# Patient Record
Sex: Female | Born: 1950 | Race: Black or African American | Hispanic: No | Marital: Married | Smoking: Never smoker
Health system: Southern US, Community
[De-identification: ages and names within clinical notes are randomized; demographics above are authoritative.]

## PROBLEM LIST (undated history)

## (undated) DIAGNOSIS — M5430 Sciatica, unspecified side: Secondary | ICD-10-CM

## (undated) DIAGNOSIS — I10 Essential (primary) hypertension: Secondary | ICD-10-CM

## (undated) DIAGNOSIS — J349 Unspecified disorder of nose and nasal sinuses: Secondary | ICD-10-CM

## (undated) DIAGNOSIS — M1710 Unilateral primary osteoarthritis, unspecified knee: Secondary | ICD-10-CM

## (undated) DIAGNOSIS — H269 Unspecified cataract: Secondary | ICD-10-CM

## (undated) HISTORY — DX: Unspecified cataract: H26.9

## (undated) HISTORY — DX: Sciatica, unspecified side: M54.30

## (undated) HISTORY — DX: Unspecified disorder of nose and nasal sinuses: J34.9

## (undated) HISTORY — DX: Unilateral primary osteoarthritis, unspecified knee: M17.10

## (undated) HISTORY — PX: OTHER SURGICAL HISTORY: SHX169

## (undated) HISTORY — DX: Essential (primary) hypertension: I10

---

## 1997-07-10 ENCOUNTER — Ambulatory Visit (HOSPITAL_COMMUNITY): Admission: RE | Admit: 1997-07-10 | Discharge: 1997-07-10 | Payer: Self-pay

## 1998-04-03 ENCOUNTER — Emergency Department (HOSPITAL_COMMUNITY): Admission: EM | Admit: 1998-04-03 | Discharge: 1998-04-03 | Payer: Self-pay | Admitting: Emergency Medicine

## 1998-04-04 ENCOUNTER — Encounter: Payer: Self-pay | Admitting: Emergency Medicine

## 1998-06-07 ENCOUNTER — Encounter: Payer: Self-pay | Admitting: Emergency Medicine

## 1998-06-08 ENCOUNTER — Inpatient Hospital Stay (HOSPITAL_COMMUNITY): Admission: EM | Admit: 1998-06-08 | Discharge: 1998-06-10 | Payer: Self-pay | Admitting: *Deleted

## 1998-06-10 ENCOUNTER — Encounter: Payer: Self-pay | Admitting: Cardiology

## 1999-09-30 ENCOUNTER — Ambulatory Visit (HOSPITAL_COMMUNITY): Admission: RE | Admit: 1999-09-30 | Discharge: 1999-09-30 | Payer: Self-pay | Admitting: Cardiology

## 1999-09-30 ENCOUNTER — Encounter: Payer: Self-pay | Admitting: Cardiology

## 1999-12-16 ENCOUNTER — Ambulatory Visit (HOSPITAL_COMMUNITY): Admission: RE | Admit: 1999-12-16 | Discharge: 1999-12-16 | Payer: Self-pay | Admitting: Cardiology

## 1999-12-16 ENCOUNTER — Encounter: Payer: Self-pay | Admitting: Cardiology

## 2000-03-05 ENCOUNTER — Ambulatory Visit (HOSPITAL_COMMUNITY): Admission: RE | Admit: 2000-03-05 | Discharge: 2000-03-05 | Payer: Self-pay | Admitting: Cardiology

## 2000-07-05 ENCOUNTER — Encounter: Admission: RE | Admit: 2000-07-05 | Discharge: 2000-07-05 | Payer: Self-pay | Admitting: Cardiology

## 2000-07-05 ENCOUNTER — Encounter: Payer: Self-pay | Admitting: Cardiology

## 2000-08-10 ENCOUNTER — Emergency Department (HOSPITAL_COMMUNITY): Admission: EM | Admit: 2000-08-10 | Discharge: 2000-08-11 | Payer: Self-pay | Admitting: *Deleted

## 2000-08-16 ENCOUNTER — Emergency Department (HOSPITAL_COMMUNITY): Admission: EM | Admit: 2000-08-16 | Discharge: 2000-08-16 | Payer: Self-pay | Admitting: Emergency Medicine

## 2000-08-16 ENCOUNTER — Encounter: Payer: Self-pay | Admitting: Emergency Medicine

## 2000-08-24 ENCOUNTER — Encounter: Admission: RE | Admit: 2000-08-24 | Discharge: 2000-09-10 | Payer: Self-pay | Admitting: Cardiology

## 2000-10-05 ENCOUNTER — Encounter: Admission: RE | Admit: 2000-10-05 | Discharge: 2000-10-27 | Payer: Self-pay | Admitting: Cardiology

## 2001-02-07 ENCOUNTER — Encounter: Payer: Self-pay | Admitting: Gastroenterology

## 2001-02-07 DIAGNOSIS — K648 Other hemorrhoids: Secondary | ICD-10-CM | POA: Insufficient documentation

## 2001-02-22 ENCOUNTER — Encounter: Payer: Self-pay | Admitting: Cardiology

## 2001-02-22 ENCOUNTER — Encounter: Admission: RE | Admit: 2001-02-22 | Discharge: 2001-02-22 | Payer: Self-pay | Admitting: Cardiology

## 2002-05-18 ENCOUNTER — Other Ambulatory Visit: Admission: RE | Admit: 2002-05-18 | Discharge: 2002-05-18 | Payer: Self-pay | Admitting: Obstetrics and Gynecology

## 2003-02-03 ENCOUNTER — Emergency Department (HOSPITAL_COMMUNITY): Admission: EM | Admit: 2003-02-03 | Discharge: 2003-02-04 | Payer: Self-pay | Admitting: Emergency Medicine

## 2003-05-31 ENCOUNTER — Other Ambulatory Visit: Admission: RE | Admit: 2003-05-31 | Discharge: 2003-05-31 | Payer: Self-pay | Admitting: Obstetrics and Gynecology

## 2003-06-20 ENCOUNTER — Encounter: Admission: RE | Admit: 2003-06-20 | Discharge: 2003-06-20 | Payer: Self-pay | Admitting: Cardiology

## 2003-07-11 ENCOUNTER — Encounter: Admission: RE | Admit: 2003-07-11 | Discharge: 2003-07-11 | Payer: Self-pay | Admitting: Cardiology

## 2004-10-21 ENCOUNTER — Other Ambulatory Visit: Admission: RE | Admit: 2004-10-21 | Discharge: 2004-10-21 | Payer: Self-pay | Admitting: Obstetrics and Gynecology

## 2006-05-21 ENCOUNTER — Encounter: Admission: RE | Admit: 2006-05-21 | Discharge: 2006-05-21 | Payer: Self-pay | Admitting: Orthopedic Surgery

## 2007-05-31 ENCOUNTER — Ambulatory Visit: Payer: Self-pay | Admitting: Gastroenterology

## 2007-05-31 LAB — CONVERTED CEMR LAB
ALT: 22 units/L (ref 0–35)
Basophils Absolute: 0.1 10*3/uL (ref 0.0–0.1)
Basophils Relative: 3.4 % — ABNORMAL HIGH (ref 0.0–1.0)
Calcium: 9.3 mg/dL (ref 8.4–10.5)
Creatinine, Ser: 1 mg/dL (ref 0.4–1.2)
Eosinophils Absolute: 0.2 10*3/uL (ref 0.0–0.7)
GFR calc Af Amer: 74 mL/min
GFR calc non Af Amer: 61 mL/min
Hemoglobin: 13.9 g/dL (ref 12.0–15.0)
MCHC: 32.9 g/dL (ref 30.0–36.0)
MCV: 89.1 fL (ref 78.0–100.0)
Monocytes Absolute: 0.3 10*3/uL (ref 0.1–1.0)
Neutro Abs: 0.8 10*3/uL — ABNORMAL LOW (ref 1.4–7.7)
RBC: 4.74 M/uL (ref 3.87–5.11)
RDW: 12.2 % (ref 11.5–14.6)
Sodium: 143 meq/L (ref 135–145)
TSH: 2.12 microintl units/mL (ref 0.35–5.50)
Total Bilirubin: 1.2 mg/dL (ref 0.3–1.2)

## 2007-06-21 DIAGNOSIS — K219 Gastro-esophageal reflux disease without esophagitis: Secondary | ICD-10-CM | POA: Insufficient documentation

## 2007-06-21 DIAGNOSIS — F411 Generalized anxiety disorder: Secondary | ICD-10-CM | POA: Insufficient documentation

## 2008-06-13 ENCOUNTER — Emergency Department (HOSPITAL_COMMUNITY): Admission: EM | Admit: 2008-06-13 | Discharge: 2008-06-13 | Payer: Self-pay | Admitting: Emergency Medicine

## 2008-10-15 ENCOUNTER — Encounter: Admission: RE | Admit: 2008-10-15 | Discharge: 2008-11-22 | Payer: Self-pay | Admitting: Cardiology

## 2010-04-25 ENCOUNTER — Emergency Department (HOSPITAL_COMMUNITY)
Admission: EM | Admit: 2010-04-25 | Discharge: 2010-04-26 | Disposition: A | Discharge status: Home / Self Care | Payer: BC Managed Care – PPO | Attending: Emergency Medicine | Admitting: Emergency Medicine

## 2010-04-25 DIAGNOSIS — J029 Acute pharyngitis, unspecified: Secondary | ICD-10-CM | POA: Insufficient documentation

## 2010-04-25 DIAGNOSIS — I10 Essential (primary) hypertension: Secondary | ICD-10-CM | POA: Insufficient documentation

## 2010-04-25 DIAGNOSIS — R079 Chest pain, unspecified: Secondary | ICD-10-CM | POA: Insufficient documentation

## 2010-04-25 DIAGNOSIS — T189XXA Foreign body of alimentary tract, part unspecified, initial encounter: Secondary | ICD-10-CM | POA: Insufficient documentation

## 2010-04-25 DIAGNOSIS — IMO0002 Reserved for concepts with insufficient information to code with codable children: Secondary | ICD-10-CM | POA: Insufficient documentation

## 2010-04-25 DIAGNOSIS — K219 Gastro-esophageal reflux disease without esophagitis: Secondary | ICD-10-CM | POA: Insufficient documentation

## 2010-04-26 ENCOUNTER — Emergency Department (HOSPITAL_COMMUNITY): Payer: BC Managed Care – PPO

## 2010-04-29 ENCOUNTER — Other Ambulatory Visit: Payer: Self-pay | Admitting: Cardiology

## 2010-04-29 DIAGNOSIS — R42 Dizziness and giddiness: Secondary | ICD-10-CM

## 2010-05-01 ENCOUNTER — Inpatient Hospital Stay: Admission: RE | Admit: 2010-05-01 | Payer: BC Managed Care – PPO | Source: Ambulatory Visit

## 2010-06-17 NOTE — Assessment & Plan Note (Signed)
[facility]                         GASTROENTEROLOGY OFFICE NOTE   CHERRE, KOTHARI                         MRN:          [id number]  DATE:05/31/2007                            DOB:          1951-01-06    SUBJECTIVE:  Ms. Eilert is a 60 year old African/American female with new  onset constipation over the last six months with hard stools, although  she is having daily bowel movements.  She occasionally has some  irritation around her rectum and rectal bleeding.  She denies any upper  GI or hepatobiliary complaints.  She is on a regular diet and has known  lactose intolerance.  She continues to use lactose.  She denies any  alarming complaints otherwise.  She has had no anorexia or weight loss,  upper GI or hepatobiliary problems.   I previously saw this patient because of acid reflux symptoms in 2003,  and she had a negative endoscopy.  Also at that time she was having  hematochezia and had internal hemorrhoids noted on a colonoscopy, but  otherwise a normal exam.   PAST MEDICAL HISTORY/MEDICATIONS:  Is otherwise entirely noncontributory  except for chronic anxiety syndrome.  She recently has been treated for  a fungal infection in her left foot and has been on Lamisil and  questionable Cipro.  In her own mind, this has precipitated her  constipation problems.   ALLERGIES:  NOVOCAIN.   FAMILY HISTORY:  Noncontributory except for diabetes in her mother.   SOCIAL HISTORY:  She is married and lives with her husband.  She works  as a Manufacturing engineer.  She has a Bachelor's degree. She does not smoke or  abuse ethanol.   REVIEW OF SYSTEMS:  Entirely noncontributory.  It is unclear if the  patient has had recent laboratory screening with Dr. Osvaldo Shipper. Spruill.   PHYSICAL EXAMINATION:  GENERAL:  She is a healthy-appearing black  female, appearing her stated age, in no acute distress.  VITAL SIGNS:  She is 5 feet 4 inches and weighs 222 pounds.   Blood  pressure 120/84, pulse 64 and regular.  HEENT/NECK:  I could not appreciate stigmata of chronic liver disease or  thyromegaly.  CHEST:  Clear.  HEART:  She was in a regular rhythm without murmurs, gallops or rubs.  ABDOMEN:  Showed no organomegaly, masses or tenderness.  Bowel sounds  were normal.  EXTREMITIES:  Peripheral extremities were unremarkable.  RECTAL:  Inspection of the rectum showed some external hemorrhoids  without any definite fissures or fistulae.  The rectal exam showed a  hard stool in the rectal vault that was guaiac-negative.  NEUROLOGIC:  Mental status was clear.   ASSESSMENT:  1. 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      fluids in her diet.  2. Rectal bleeding from external hemorrhoids.  3. Rule out underlying metabolic disorder.  4. History of lactose intolerance.   RECOMMENDATIONS:  1. A high fiber diet with daily Benefiber and liberal p.o. fluids.  2. Lactose-free diet with p.r.n. lactate tablets.  3. Sitz baths at bedtime with AnaMantle  cream.  4. GI followup in two to three weeks' time.  The patient needs a      follow-up colonoscopy.  She is currently refusing.  5. Will check screening laboratory parameters including thyroid      function tests.     Vania Rea. Jarold Motto, MD, Caleen Essex, FAGA  Electronically Signed    DRP/MedQ  DD: 05/31/2007  DT: 05/31/2007  Job #: [id number]   cc:   Osvaldo Shipper. Spruill, M.D.

## 2012-10-17 ENCOUNTER — Ambulatory Visit: Payer: BC Managed Care – PPO

## 2012-10-17 ENCOUNTER — Ambulatory Visit (INDEPENDENT_AMBULATORY_CARE_PROVIDER_SITE_OTHER): Payer: BC Managed Care – PPO | Admitting: Internal Medicine

## 2012-10-17 VITALS — BP 132/84 | HR 70 | Temp 98.3°F | Resp 17 | Ht 63.0 in | Wt 212.0 lb

## 2012-10-17 DIAGNOSIS — M25579 Pain in unspecified ankle and joints of unspecified foot: Secondary | ICD-10-CM

## 2012-10-17 DIAGNOSIS — M25572 Pain in left ankle and joints of left foot: Secondary | ICD-10-CM

## 2012-10-17 DIAGNOSIS — S93409A Sprain of unspecified ligament of unspecified ankle, initial encounter: Secondary | ICD-10-CM

## 2012-10-17 MED ORDER — TRAMADOL HCL 50 MG PO TABS: 50.0000 mg | ORAL_TABLET | Freq: Three times a day (TID) | ORAL | Status: DC | PRN

## 2012-10-17 NOTE — Progress Notes (Signed)
  Subjective:    Patient ID: Sheri Griffith, female    DOB: 12-Nov-1950, 62 y.o.   MRN: [id number]  HPI Pain and swelling left ankle. Yesterday twisted her ankle while stepping down a cement step. Heard a pop. Pain 6 out of 10. Able to bear weight today with pain on ambulation.  Review of Systems  Musculoskeletal: Positive for joint swelling.       Pain and swelling left ankle  All other systems reviewed and are negative.       Objective:   Physical Exam  Nursing note and vitals reviewed. Constitutional: She is oriented to person, place, and time. She appears well-developed and well-nourished.  HENT:  Head: Normocephalic and atraumatic.  Nose: Nose normal.  Mouth/Throat: Oropharynx is clear and moist.  Eyes: Conjunctivae are normal. Pupils are equal, round, and reactive to light.  Neck: Normal range of motion. Neck supple.  Cardiovascular: Normal rate, regular rhythm and normal heart sounds.   Pulmonary/Chest: Effort normal and breath sounds normal.  Abdominal: Soft.  Musculoskeletal: She exhibits tenderness.  Tenderness swelling l ankle  Neurological: She is alert and oriented to person, place, and time.  Skin: Skin is warm and dry.  Psychiatric: She has a normal mood and affect. Her behavior is normal. Judgment and thought content normal.    [facility] reading (PRIMARY) by  Dr. Mindi Junker negitive for fracture       Assessment & Plan:  Pain and swelling left ankle will eval xray and treat with immobilization as approriate and antiinflammatory pain meds.

## 2012-10-17 NOTE — Patient Instructions (Addendum)
Take meds as directed. Keep ankle elevated. Ice every 4 hours for 20 minutes. Immobilize as directed wear the stirrup splint in your shoe for 3 weeks and elevate as directed.. Return for followup eval in one week.

## 2013-03-21 ENCOUNTER — Ambulatory Visit: Payer: BC Managed Care – PPO | Admitting: Physical Therapy

## 2013-05-15 ENCOUNTER — Ambulatory Visit: Payer: BC Managed Care – PPO | Attending: Cardiology | Admitting: Physical Therapy

## 2013-05-15 DIAGNOSIS — M545 Low back pain, unspecified: Secondary | ICD-10-CM | POA: Insufficient documentation

## 2013-05-15 DIAGNOSIS — IMO0001 Reserved for inherently not codable concepts without codable children: Secondary | ICD-10-CM | POA: Insufficient documentation

## 2013-05-15 DIAGNOSIS — R293 Abnormal posture: Secondary | ICD-10-CM | POA: Insufficient documentation

## 2013-05-15 DIAGNOSIS — M542 Cervicalgia: Secondary | ICD-10-CM | POA: Insufficient documentation

## 2013-05-15 DIAGNOSIS — M546 Pain in thoracic spine: Secondary | ICD-10-CM | POA: Diagnosis not present

## 2013-05-16 ENCOUNTER — Ambulatory Visit: Payer: BC Managed Care – PPO | Admitting: Rehabilitation

## 2013-05-16 DIAGNOSIS — IMO0001 Reserved for inherently not codable concepts without codable children: Secondary | ICD-10-CM | POA: Diagnosis not present

## 2013-05-24 ENCOUNTER — Ambulatory Visit: Payer: BC Managed Care – PPO | Attending: Cardiology | Admitting: Physical Therapy

## 2013-05-24 DIAGNOSIS — R293 Abnormal posture: Secondary | ICD-10-CM | POA: Insufficient documentation

## 2013-05-24 DIAGNOSIS — M545 Low back pain, unspecified: Secondary | ICD-10-CM | POA: Insufficient documentation

## 2013-05-24 DIAGNOSIS — Z5189 Encounter for other specified aftercare: Secondary | ICD-10-CM | POA: Insufficient documentation

## 2013-05-24 DIAGNOSIS — M546 Pain in thoracic spine: Secondary | ICD-10-CM | POA: Insufficient documentation

## 2013-05-24 DIAGNOSIS — M542 Cervicalgia: Secondary | ICD-10-CM | POA: Insufficient documentation

## 2013-05-25 ENCOUNTER — Ambulatory Visit: Payer: BC Managed Care – PPO | Admitting: Rehabilitation

## 2013-05-25 DIAGNOSIS — IMO0001 Reserved for inherently not codable concepts without codable children: Secondary | ICD-10-CM | POA: Diagnosis not present

## 2013-05-31 ENCOUNTER — Ambulatory Visit: Payer: BC Managed Care – PPO | Admitting: Rehabilitation

## 2013-05-31 DIAGNOSIS — IMO0001 Reserved for inherently not codable concepts without codable children: Secondary | ICD-10-CM | POA: Diagnosis not present

## 2013-06-07 ENCOUNTER — Ambulatory Visit: Payer: BC Managed Care – PPO | Attending: Cardiology | Admitting: Rehabilitation

## 2013-06-07 DIAGNOSIS — M546 Pain in thoracic spine: Secondary | ICD-10-CM | POA: Insufficient documentation

## 2013-06-07 DIAGNOSIS — M542 Cervicalgia: Secondary | ICD-10-CM | POA: Insufficient documentation

## 2013-06-07 DIAGNOSIS — M545 Low back pain, unspecified: Secondary | ICD-10-CM | POA: Insufficient documentation

## 2013-06-07 DIAGNOSIS — IMO0001 Reserved for inherently not codable concepts without codable children: Secondary | ICD-10-CM | POA: Insufficient documentation

## 2013-06-07 DIAGNOSIS — R293 Abnormal posture: Secondary | ICD-10-CM | POA: Insufficient documentation

## 2013-06-08 ENCOUNTER — Ambulatory Visit: Payer: BC Managed Care – PPO | Admitting: Physical Therapy

## 2013-06-12 ENCOUNTER — Ambulatory Visit: Payer: BC Managed Care – PPO | Admitting: Physical Therapy

## 2013-06-15 ENCOUNTER — Ambulatory Visit: Payer: BC Managed Care – PPO | Admitting: Physical Therapy

## 2013-06-20 ENCOUNTER — Encounter: Payer: BC Managed Care – PPO | Admitting: Physical Therapy

## 2013-06-22 ENCOUNTER — Ambulatory Visit: Payer: BC Managed Care – PPO | Admitting: Rehabilitation

## 2014-05-22 ENCOUNTER — Encounter: Payer: Self-pay | Admitting: Internal Medicine

## 2014-07-05 ENCOUNTER — Encounter: Payer: Self-pay | Admitting: Internal Medicine

## 2014-07-18 ENCOUNTER — Ambulatory Visit (INDEPENDENT_AMBULATORY_CARE_PROVIDER_SITE_OTHER): Payer: Self-pay

## 2014-07-18 ENCOUNTER — Ambulatory Visit (INDEPENDENT_AMBULATORY_CARE_PROVIDER_SITE_OTHER): Payer: Self-pay | Admitting: Family Medicine

## 2014-07-18 VITALS — BP 142/90 | HR 57 | Temp 98.1°F | Resp 18 | Ht 63.0 in | Wt 214.0 lb

## 2014-07-18 DIAGNOSIS — S8392XA Sprain of unspecified site of left knee, initial encounter: Secondary | ICD-10-CM

## 2014-07-18 DIAGNOSIS — M25562 Pain in left knee: Secondary | ICD-10-CM

## 2014-07-18 MED ORDER — MELOXICAM 15 MG PO TABS: 15.0000 mg | ORAL_TABLET | Freq: Every day | ORAL | Status: DC

## 2014-07-18 NOTE — Progress Notes (Signed)
Subjective:    Patient ID: Sheri Griffith, female    DOB: 06-05-1950, 64 y.o.   MRN: [id number]  07/18/2014  Knee Pain   HPI This 64 y.o. female presents for evaluation of L knee pain.  Suffered with L knee pain four months ago.  Was doing exercises with marching with acute worsening L knee pain after exercise.  No swelling.  Feels warm since. Icing and ointment.  No popping.  No giving out.  Pain under knee cap and posteriorly.  Stiffness.  Mild pain.  No limping.  Exercise makes worse.  Must walk slowly.  Taking Ibuprofen 2 every four six hours; started those yesterday.  Unemployed.  Exercising four days per week.     Review of Systems  Constitutional: Negative for fever, chills, diaphoresis and fatigue.  Musculoskeletal: Positive for myalgias, joint swelling, arthralgias and gait problem. Negative for back pain.  Skin: Negative for color change and rash.  Neurological: Negative for weakness and numbness.    Past Medical History  Diagnosis Date  . Sinus problem    History reviewed. No pertinent past surgical history. Allergies  Allergen Reactions  . Procaine Hcl    Current Outpatient Prescriptions  Medication Sig Dispense Refill  . ibuprofen (ADVIL,MOTRIN) 200 MG tablet Take 200 mg by mouth every 6 (six) hours as needed for pain.    . meloxicam (MOBIC) 15 MG tablet Take 1 tablet (15 mg total) by mouth daily. 30 tablet 0  . traMADol (ULTRAM) 50 MG tablet Take 1 tablet (50 mg total) by mouth every 8 (eight) hours as needed for pain. (Patient not taking: Reported on 07/18/2014) 30 tablet 0   No current facility-administered medications for this visit.   History   Social History  . Marital Status: Married    Spouse Name: N/A  . Number of Children: N/A  . Years of Education: N/A   Occupational History  . Not on file.   Social History Main Topics  . Smoking status: Never Smoker   . Smokeless tobacco: Not on file  . Alcohol Use: No  . Drug Use: No  . Sexual Activity: Yes      Birth Control/ Protection: None   Other Topics Concern  . Not on file   Social History Narrative       Objective:    BP 142/90 mmHg  Pulse 57  Temp(Src) 98.1 F (36.7 C) (Oral)  Resp 18  Ht 5\' 3"  (1.6 m)  Wt 214 lb (97.07 kg)  BMI 37.92 kg/m2  SpO2 95% Physical Exam  Constitutional: She is oriented to person, place, and time. She appears well-developed and well-nourished. No distress.  obese  HENT:  Head: Normocephalic and atraumatic.  Eyes: Conjunctivae are normal. Pupils are equal, round, and reactive to light.  Neck: Normal range of motion. Neck supple.  Cardiovascular: Normal rate, regular rhythm and normal heart sounds.  Exam reveals no gallop and no friction rub.   No murmur heard. Pulmonary/Chest: Effort normal and breath sounds normal. She has no wheezes. She has no rales.  Musculoskeletal:       Left knee: She exhibits bony tenderness. She exhibits normal range of motion, no swelling, no effusion, no ecchymosis, no deformity, no laceration, no erythema, normal alignment, normal patellar mobility and normal meniscus. Tenderness found. Medial joint line tenderness noted. No lateral joint line, no MCL, no LCL and no patellar tendon tenderness noted.  L knee: +TTP superior aspect of patella; +TTP medial joint line; no swelling; McMurray's  negative; Lachman's negative; V/V strain intact; full extension and flexion.    Neurological: She is alert and oriented to person, place, and time.  Skin: She is not diaphoretic.  Psychiatric: She has a normal mood and affect. Her behavior is normal.  Nursing note and vitals reviewed.   [facility] reading (PRIMARY) by  Dr. Tamala Julian.  L KNEE FILMS: SPURRING AND MILD DEGENERATIVE CHANGES.       Assessment & Plan:   1. Left knee pain   2. Sprain, knee, left, initial encounter    -New. -Recommend rest, icing, elevation, supportive tennis shoe, Mobic daily for two weeks. -Home exercise program provided to perform daily. -If no  improvement in two weeks, call for ortho referral.   Meds ordered this encounter  Medications  . meloxicam (MOBIC) 15 MG tablet    Sig: Take 1 tablet (15 mg total) by mouth daily.    Dispense:  30 tablet    Refill:  0    No Follow-up on file.   Kenise Barraco Elayne Guerin, M.D. [facility] [address] [place],   [id number] [phone] phone [phone] fax

## 2014-07-18 NOTE — Patient Instructions (Signed)
Medial Collateral Knee Ligament Sprain  with Phase I Rehab The medial collateral ligament (MCL) of the knee helps hold the knee joint in proper alignment and prevents the bones from shifting out of alignment (displacing) to the inside (medially). Injury to the knee may cause a tear in the MCL ligament (sprain). Sprains may heal without treatment, but this often results in a loose joint. Sprains are classified into three categories. Grade 1 sprains cause pain, but the tendon is not lengthened. Grade 2 sprains include a lengthened ligament, due to the ligament being stretched or partially ruptured. With grade 2 sprains, there is still function, although possibly decreased. Grade 3 sprains involve a complete tear of the tendon or muscle, and function is usually impaired. SYMPTOMS   Pain and tenderness on the inner side of the knee.  A "pop," tearing or pulling sensation at the time of injury.  Bruising (contusion) at the site of injury, within 48 hours of injury.  Knee stiffness.  Limping, often walking with the knee bent. CAUSES  An MCL sprain occurs when a force is placed on the ligament that is greater than it can handle. Common mechanisms of injury include:  Direct hit (trauma) to the outer side of the knee, especially if the foot is planted on the ground.  Forceful pivoting of the body and leg, while the foot is planted on the ground. RISK INCREASES WITH:  Contact sports (football, rugby).  Sports that require pivoting or cutting (soccer).  Poor knee strength and flexibility.  Improper equipment use. PREVENTION  Warm up and stretch properly before activity.  Maintain physical fitness:  Strength, flexibility and endurance.  Cardiovascular fitness.  Wear properly fitted protective equipment (correct length of cleats for surface).  Functional braces may be effective in preventing injury. PROGNOSIS  MCL tears usually heal without the need for surgery. Sometimes however,  surgery is required. RELATED COMPLICATIONS  Frequently recurring symptoms, such as the knee giving way, knee instability or knee swelling.  Injury to other structures in the knee joint:  Meniscal cartilage, resulting in locking and swelling of the knee.  Articular cartilage, resulting in knee arthritis.  Other ligaments of the knee.  Injury to nerves, resulting in numbness of the outer leg, foot or ankle and weakness or paralysis, with inability to raise the ankle or toes.  Knee stiffness. TREATMENT Treatment first involves the use of ice and medicine, to reduce pain and inflammation. The use of strengthening and stretching exercises may help reduce pain with activity. These exercises may be performed at home, but referral to a therapist is often advised. You may be advised to walk with crutches until you are able to walk without a limp. Your caregiver may provide you with a hinged knee brace to help regain a full range of motion, while also protecting the injured knee. For severe MCL injuries or injuries that involve other ligaments of the knee, surgery is often advised. MEDICATION  Do not take pain medicine for 7 days before surgery.  Only use over-the-counter pain medicine as directed by your caregiver.  Only use prescription pain relievers as directed and only in needed amounts. HEAT AND COLD  Cold treatment (icing) should be applied for 10 to 15 minutes every 2 to 3 hours for inflammation and pain, and immediately after any activity, that aggravates the symptoms. Use ice packs or an ice massage.  Heat treatment may be used before performing stretching and strengthening activities prescribed by your caregiver, physical therapist or athletic trainer.   Use a heat pack or warm water soak. SEEK MEDICAL CARE IF:   Symptoms get worse or do not improve in 4 to 6 weeks, despite treatment.  New, unexplained symptoms develop. EXERCISES  PHASE I EXERCISES  RANGE OF MOTION (ROM) AND  STRETCHING EXERCISES-Medial Collateral Knee Ligament Sprain Phase I These are some of the initial exercises that your physician, physical therapist or athletic trainer may have you perform to begin your rehabilitation. When you demonstrate gains in your flexibility and strength, your caregiver may progress you to Phase II exercises. As you perform these exercises, remember:  These initial exercises are intended to be gentle. They will help you restore motion without increasing any swelling.  Completing these exercises allows less painful movement and prepares you for the more aggressive strengthening exercises in Phase II.  An effective stretch should be held for at least 30 seconds.  A stretch should never be painful. You should only feel a gentle lengthening or release in the stretched tissue. RANGE OF MOTION-Knee Flexion, Active  Lie on your back with both knees straight. (If this causes back discomfort, bend your healthy knee, placing your foot flat on the floor.)  Slowly slide your heel back toward your buttocks until you feel a gentle stretch in the front of your knee or thigh.  Hold for __________ seconds. Slowly slide your heel back to the starting position. Repeat __________ times. Complete this exercise __________ times per day. STRETCH-Knee Flexion, Supine  Lie on the floor with your right / left heel and foot lightly touching the wall. (Place both feet on the wall if you do not use a door frame.)  Without using any effort, allow gravity to slide your foot down the wall slowly until you feel a gentle stretch in the front of your right / left knee.  Hold this stretch for __________ seconds. Then return the leg to the starting position, using your health leg for help, if needed. Repeat __________ times. Complete this stretch __________ times per day. RANGE OF MOTION-Knee Flexion and Extension, Active-Assisted  Sit on the edge of a table or chair with your thighs firmly supported.  It may be helpful to place a folded towel under the end of your right / left thigh.  Flexion (bending): Place the ankle of your healthy leg on top of the other ankle. Use your healthy leg to gently bend your right / left knee until you feel a mild tension across the top of your knee.  Hold for __________ seconds.  Extension (straightening): Switch your ankles so your right / left leg is on top. Use your healthy leg to straighten your right / left knee until you feel a mild tension on the backside of your knee.  Hold for __________ seconds. Repeat __________ times. Complete this exercise __________ times per day. STRETCH-Knee Extension Sitting  Sit with your right / left leg/heel propped on another chair, coffee table, or foot stool.  Allow your leg muscles to relax, letting gravity straighten out your knee.*  You should feel a stretch behind your right / left knee. Hold this position for __________ seconds. Repeat __________ times. Complete this stretch __________ times per day. *Your physician, physical therapist or athletic trainer may instruct you to place a __________ weight on your thigh, just above your kneecap, to deepen the stretch. STRENGTHENING EXERCISES-Medial Collateral Knee Ligament Sprain Phase I These exercises may help you when beginning to rehabilitate your injury. They may resolve your symptoms with or without further involvement   from your physician, physical therapist or athletic trainer. While completing these exercises, remember:   In order to return to more demanding activities, you will likely need to progress to more challenging exercises. Your physician, physical therapist or athletic trainer will advance your exercises when your tissues show adequate healing and your muscles demonstrate increased strength.  Muscles can gain both the endurance and the strength needed for everyday activities through controlled exercises.  Complete these exercises as instructed by  your physician, physical therapist or athletic trainer. Increase the resistance and repetitions only as guided by your caregiver. STRENGTH-Quadriceps, Isometrics  Lie on your back with your right / left leg extended and your opposite knee bent.  Gradually tense the muscles in the front of your right / left thigh. You should see either your kneecap slide up toward your hip or an increased dimpling just above the knee. This motion will push the back of the knee down toward the floor, mat or bed on which you are lying.  Hold the muscle as tight as you can without increasing your pain for __________ seconds.  Relax the muscles slowly and completely in between each repetition. Repeat __________ times. Complete this exercise __________ times per day. STRENGTH-Quadriceps, Short Arcs  Lie on your back. Place a __________ inch towel roll under your knee so that the knee slightly bends.  Raise only your lower leg by tightening the muscles in the front of your thigh. Do not allow your thigh to rise.  Hold this position for __________ seconds. Repeat __________ times. Complete this exercise __________ times per day. OPTIONAL ANKLE WEIGHTS: Begin with ____________________, but DO NOT exceed ____________________. Increase in 1 pound/0.5 kilogram increments.  STRENGTH--Quadriceps, Straight Leg Raises Quality counts! Watch for signs that the quadriceps muscle is working, to be sure you are strengthening the correct muscles and not "cheating" by substituting with healthier muscles.  Lay on your back with your right / left leg extended and your opposite knee bent.  Tense the muscles in the front of your right / left thigh. You should see either your knee cap slide up or increased dimpling just above the knee. Your thigh may even shake a bit.  Tighten these muscles even more and raise your leg 4 to 6 inches off the floor. Hold for __________ seconds.  Keeping these muscles tense, lower your leg.  Relax  the muscles slowly and completely in between each repetition. Repeat __________ times. Complete this exercise __________ times per day. STRENGTH-Hamstring, Isometrics  Lie on your back on a firm surface.  Bend your right / left knee approximately __________ degrees.  Dig your heel into the surface as if you are trying to pull it toward your buttocks. Tighten the muscles in the back of your thighs to "dig" as hard as you can, without increasing any pain.  Hold this position for __________ seconds.  Release the tension gradually and allow your muscle to completely relax for __________ seconds in between each exercise. Repeat __________ times. Complete this exercise __________ times per day. STRENGTH-Hamstring, Curls  Lay on your stomach with your legs extended. (If you lay on a bed, your feet may hang over the edge.)  Tighten the muscles in the back of your thigh to bend your right / left knee up to 90 degrees. Keep your hips flat on the bed.  Hold this position for __________ seconds.  Slowly lower your leg back to the starting position. Repeat __________ times. Complete this exercise __________ times per day.   OPTIONAL ANKLE WEIGHTS: Begin with ____________________, but DO NOT exceed ____________________. Increase in 1 pound/0.5 kilogram increments.  Document Released: 01/19/2005 Document Revised: 04/13/2011 Document Reviewed: 05/03/2008 [employer] Patient Information 2015 [employer]. This information is not intended to replace advice given to you by your health care provider. Make sure you discuss any questions you have with your health care provider.  

## 2016-07-07 DIAGNOSIS — Z008 Encounter for other general examination: Secondary | ICD-10-CM | POA: Diagnosis not present

## 2016-07-07 DIAGNOSIS — R140 Abdominal distension (gaseous): Secondary | ICD-10-CM | POA: Diagnosis not present

## 2016-07-14 DIAGNOSIS — R140 Abdominal distension (gaseous): Secondary | ICD-10-CM | POA: Diagnosis not present

## 2016-07-29 DIAGNOSIS — E669 Obesity, unspecified: Secondary | ICD-10-CM | POA: Diagnosis not present

## 2016-07-29 DIAGNOSIS — K219 Gastro-esophageal reflux disease without esophagitis: Secondary | ICD-10-CM | POA: Diagnosis not present

## 2016-07-29 DIAGNOSIS — R140 Abdominal distension (gaseous): Secondary | ICD-10-CM | POA: Diagnosis not present

## 2016-07-29 DIAGNOSIS — R109 Unspecified abdominal pain: Secondary | ICD-10-CM | POA: Diagnosis not present

## 2016-07-29 DIAGNOSIS — I10 Essential (primary) hypertension: Secondary | ICD-10-CM | POA: Diagnosis not present

## 2016-08-06 ENCOUNTER — Encounter: Payer: Self-pay | Admitting: Gastroenterology

## 2016-09-04 ENCOUNTER — Encounter: Payer: Self-pay | Admitting: Gastroenterology

## 2016-09-04 ENCOUNTER — Ambulatory Visit (INDEPENDENT_AMBULATORY_CARE_PROVIDER_SITE_OTHER): Payer: Medicare Other | Admitting: Gastroenterology

## 2016-09-04 ENCOUNTER — Other Ambulatory Visit (INDEPENDENT_AMBULATORY_CARE_PROVIDER_SITE_OTHER): Payer: Medicare Other

## 2016-09-04 ENCOUNTER — Encounter (INDEPENDENT_AMBULATORY_CARE_PROVIDER_SITE_OTHER): Payer: Self-pay

## 2016-09-04 VITALS — BP 156/90 | HR 72 | Ht 63.0 in | Wt 195.0 lb

## 2016-09-04 DIAGNOSIS — R140 Abdominal distension (gaseous): Secondary | ICD-10-CM | POA: Diagnosis not present

## 2016-09-04 DIAGNOSIS — R1084 Generalized abdominal pain: Secondary | ICD-10-CM | POA: Diagnosis not present

## 2016-09-04 LAB — CBC WITH DIFFERENTIAL/PLATELET
BASOS ABS: 0 10*3/uL (ref 0.0–0.1)
Basophils Relative: 1 % (ref 0.0–3.0)
EOS PCT: 1.4 % (ref 0.0–5.0)
Eosinophils Absolute: 0.1 10*3/uL (ref 0.0–0.7)
HCT: 44.5 % (ref 36.0–46.0)
Hemoglobin: 14.8 g/dL (ref 12.0–15.0)
LYMPHS ABS: 1.6 10*3/uL (ref 0.7–4.0)
Lymphocytes Relative: 46.9 % — ABNORMAL HIGH (ref 12.0–46.0)
MCHC: 33.3 g/dL (ref 30.0–36.0)
MCV: 89.4 fl (ref 78.0–100.0)
MONO ABS: 0.3 10*3/uL (ref 0.1–1.0)
Monocytes Relative: 8.4 % (ref 3.0–12.0)
NEUTROS ABS: 1.5 10*3/uL (ref 1.4–7.7)
NEUTROS PCT: 42.3 % — AB (ref 43.0–77.0)
PLATELETS: 194 10*3/uL (ref 150.0–400.0)
RBC: 4.97 Mil/uL (ref 3.87–5.11)
RDW: 13.2 % (ref 11.5–15.5)
WBC: 3.5 10*3/uL — ABNORMAL LOW (ref 4.0–10.5)

## 2016-09-04 LAB — BASIC METABOLIC PANEL
BUN: 12 mg/dL (ref 6–23)
CALCIUM: 9.4 mg/dL (ref 8.4–10.5)
CO2: 29 meq/L (ref 19–32)
CREATININE: 0.9 mg/dL (ref 0.40–1.20)
Chloride: 107 mEq/L (ref 96–112)
GFR: 80.71 mL/min (ref >60.00)
Glucose, Bld: 105 mg/dL — ABNORMAL HIGH (ref 70–99)
Potassium: 4.2 mEq/L (ref 3.5–5.1)
SODIUM: 141 meq/L (ref 135–145)

## 2016-09-04 NOTE — Progress Notes (Signed)
09/04/2016 Sheri Griffith [id number] 02-23-1950   HISTORY OF PRESENT ILLNESS:  This is a pleasant 66 year old female who is known to our office only for screening colonoscopy by Dr. Sharlett Iles in 2003. At that time study only showed internal hemorrhoids. Her care will be assumed by Dr. Henrene Pastor. She is here today for evaluation regarding diffuse abdominal bloating and discomfort related to that. She tells me that this began suddenly at the end of May. She says that she remembers lying in bed one day and it felt just like a sponge was absorbing liquid and she has remained bloated since that time. First she saw gynecology in May and they did pelvic exam, pelvic ultrasounds, and labs that all ended up being unrevealing. CA 125 was normal. CBC, TSH, CMP were all within normal limits.  She denies absolutely any other associated symptoms. She says that she moves her bowels well and does not ever have any dark or bloody stools. She denies any localized abdominal pain. She denies nausea, vomiting, fever, chills, heartburn or reflux. She has very limited past history and takes very little medication.  Past Medical History:  Diagnosis Date  . Arthritis of knee   . Hypertension   . Sinus problem    Past Surgical History:  Procedure Laterality Date  . NONE      reports that she has never smoked. She has never used smokeless tobacco. She reports that she drinks alcohol. She reports that she does not use drugs. family history includes Other in her father and mother. Allergies  Allergen Reactions  . Procaine Hcl       Outpatient Encounter Prescriptions as of 09/04/2016  Medication Sig  . loratadine (CLARITIN) 10 MG tablet Take 10 mg by mouth daily.  Marland Kitchen losartan (COZAAR) 100 MG tablet Take 100 mg by mouth daily.  . [DISCONTINUED] ibuprofen (ADVIL,MOTRIN) 200 MG tablet Take 200 mg by mouth every 6 (six) hours as needed for pain.  . [DISCONTINUED] meloxicam (MOBIC) 15 MG tablet Take 1 tablet (15 mg total) by  mouth daily.  . [DISCONTINUED] traMADol (ULTRAM) 50 MG tablet Take 1 tablet (50 mg total) by mouth every 8 (eight) hours as needed for pain. (Patient not taking: Reported on 07/18/2014)   No facility-administered encounter medications on file as of 09/04/2016.      REVIEW OF SYSTEMS  : All other systems reviewed and negative except where noted in the History of Present Illness.   PHYSICAL EXAM: BP (!) 156/90   Pulse 72   Ht 5\' 3"  (1.6 m)   Wt 195 lb (88.5 kg)   BMI 34.54 kg/m  General: Well developed black female in no acute distress Head: Normocephalic and atraumatic Eyes:  Sclerae anicteric, conjunctiva pink. Ears: Normal auditory acuity Lungs: Clear throughout to auscultation; no increased WOB. Heart: Regular rate and rhythm; no M/R/G. Abdomen: Soft, mildly distended.  Slightly tympanitic.  BS present.  Non-tender. Musculoskeletal: Symmetrical with no gross deformities  Skin: No lesions on visible extremities Extremities: No edema  Neurological: Alert oriented x 4, grossly non-focal Psychological:  Alert and cooperative. Normal mood and affect  ASSESSMENT AND PLAN: -66 year old female with complaints of new onset diffuse abdominal bloating with associated generalized abdominal discomfort related to that.  No other GI complaints. I'm unsure what is causing this. It does not seem like ascites. She has recently had thorough evaluation by gynecology. I really think the next step in evaluation will be a CT scan of the abdomen and pelvis,  mostly ruling out any type of malignancy within the abdomen. -Screening colonoscopy:  She is due for this as her last was in 2003, but would like to wait to schedule until after CT scan.   CC:  No ref. provider found

## 2016-09-04 NOTE — Progress Notes (Signed)
Assessment and plans reviewed  

## 2016-09-04 NOTE — Patient Instructions (Signed)
Your physician has requested that you go to the basement for the following lab work before leaving today: BMET, CBC   You have been scheduled for a CT scan of the abdomen and pelvis at [facility] ([address]---this is in the same building as [facility]).   You are scheduled on 09/09/16 at 3 pm. You should arrive 15 minutes prior to your appointment time for registration. Please follow the written instructions below on the day of your exam:  WARNING: IF YOU ARE ALLERGIC TO IODINE/X-RAY DYE, PLEASE NOTIFY RADIOLOGY IMMEDIATELY AT [phone]! YOU WILL BE GIVEN A 13 HOUR PREMEDICATION PREP.  1) Do not eat anything after 11 am (4 hours prior to your test) 2) You have been given 2 bottles of oral contrast to drink. The solution may taste better if refrigerated, but do NOT add ice or any other liquid to this solution. Shake well before drinking.    Drink 1 bottle of contrast @ 1 pm (2 hours prior to your exam)  Drink 1 bottle of contrast @ 2 pm (1 hour prior to your exam)  You may take any medications as prescribed with a small amount of water except for the following: Metformin, Glucophage, Glucovance, Avandamet, Riomet, Fortamet, Actoplus Met, Janumet, Glumetza or Metaglip. The above medications must be held the day of the exam AND 48 hours after the exam.  The purpose of you drinking the oral contrast is to aid in the visualization of your intestinal tract. The contrast solution may cause some diarrhea. Before your exam is started, you will be given a small amount of fluid to drink. Depending on your individual set of symptoms, you may also receive an intravenous injection of x-ray contrast/dye. Plan on being at [facility] for 30 minutes or longer, depending on the type of exam you are having performed.  This test typically takes 30-45 minutes to complete.  If you have any questions regarding your exam or if you need to reschedule, you may call the CT department  at [phone] between the hours of 8:00 am and 5:00 pm, Monday-Friday.  ________________________________________________________________________

## 2016-09-08 ENCOUNTER — Telehealth: Payer: Self-pay

## 2016-09-08 NOTE — Telephone Encounter (Signed)
Blood sugar is slightly elevated, but unsure if this is fasting. Otherwise they're fine. They were only performed due to requirement prior to undergoing her CT scan so I figured that we would just call her with those results once we get the CT scan results as well.

## 2016-09-08 NOTE — Telephone Encounter (Signed)
Sheri Griffith have you reviewed the labs from 8/3.  The pt is calling for results?

## 2016-09-08 NOTE — Telephone Encounter (Signed)
The pt has been advised and aware we will call as soon as CT is resulted

## 2016-09-09 ENCOUNTER — Ambulatory Visit (INDEPENDENT_AMBULATORY_CARE_PROVIDER_SITE_OTHER)
Admission: RE | Admit: 2016-09-09 | Discharge: 2016-09-09 | Disposition: A | Discharge status: Home / Self Care | Payer: Medicare Other | Source: Ambulatory Visit | Attending: Gastroenterology | Admitting: Gastroenterology

## 2016-09-09 DIAGNOSIS — R1084 Generalized abdominal pain: Secondary | ICD-10-CM | POA: Diagnosis not present

## 2016-09-09 DIAGNOSIS — D259 Leiomyoma of uterus, unspecified: Secondary | ICD-10-CM | POA: Diagnosis not present

## 2016-09-09 DIAGNOSIS — R140 Abdominal distension (gaseous): Secondary | ICD-10-CM

## 2016-09-09 MED ORDER — IOPAMIDOL (ISOVUE-300) INJECTION 61%
100.0000 mL | Freq: Once | INTRAVENOUS | Status: AC | PRN
  Administered 2016-09-09: 100 mL via INTRAVENOUS

## 2016-09-18 ENCOUNTER — Telehealth: Payer: Self-pay | Admitting: Gastroenterology

## 2016-09-18 NOTE — Telephone Encounter (Signed)
The pt has been advised and will call back to set up colon at a better time, she is currently driving

## 2016-09-18 NOTE — Telephone Encounter (Signed)
Patient came into office to get results from CT scan on 09/09/16. Patient states okay to call back and leave message.

## 2016-09-18 NOTE — Telephone Encounter (Signed)
Zehr, Laban Emperor, PA-C sent to Jeoffrey Massed, RN        Please let the patient know that her CT scan was normal/negative. No cause of her symptoms found. It only showed small uterine fibroid, which I believe she is already aware of. Not sure what is causing her symptoms at this point. She is due/overdue for colonoscopy so that should be scheduled if patient is agreeable.   Thank you,   Jess

## 2016-09-22 ENCOUNTER — Ambulatory Visit: Payer: Self-pay | Admitting: Gastroenterology

## 2017-01-30 ENCOUNTER — Ambulatory Visit (INDEPENDENT_AMBULATORY_CARE_PROVIDER_SITE_OTHER): Payer: Medicare Other | Admitting: Physician Assistant

## 2017-01-30 ENCOUNTER — Other Ambulatory Visit: Payer: Self-pay

## 2017-01-30 ENCOUNTER — Encounter: Payer: Self-pay | Admitting: Physician Assistant

## 2017-01-30 VITALS — BP 133/84 | HR 88 | Temp 98.4°F | Resp 16 | Ht 64.0 in | Wt 191.0 lb

## 2017-01-30 DIAGNOSIS — I10 Essential (primary) hypertension: Secondary | ICD-10-CM | POA: Diagnosis not present

## 2017-01-30 DIAGNOSIS — M545 Low back pain, unspecified: Secondary | ICD-10-CM

## 2017-01-30 MED ORDER — CYCLOBENZAPRINE HCL 10 MG PO TABS: 5.0000 mg | ORAL_TABLET | Freq: Three times a day (TID) | ORAL | 0 refills | Status: DC | PRN

## 2017-01-30 MED ORDER — IBUPROFEN 600 MG PO TABS: 600.0000 mg | ORAL_TABLET | Freq: Three times a day (TID) | ORAL | 0 refills | Status: DC | PRN

## 2017-01-30 NOTE — Progress Notes (Signed)
    01/30/2017 10:13 AM   DOB: 07-13-1950 / MRN: [id number]  SUBJECTIVE:  Sheri Griffith is a 66 y.o. female with a history of HTN presenting for back pain that started 6 days ago while putting on her panty hose before church. She feels that she is getting worse.  No weakness or numbness.  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  Denies history of CAD, renal disease, GI bleed.   She is allergic to procaine hcl.   She  has a past medical history of Arthritis of knee, Hypertension, and Sinus problem.    She  reports that  has never smoked. she has never used smokeless tobacco. She reports that she drinks alcohol. She reports that she does not use drugs. She  reports that she currently engages in sexual activity. She reports using the following method of birth control/protection: None. The patient  has a past surgical history that includes NONE.  Her family history includes Other in her father and mother.  Review of Systems  Constitutional: Negative for chills, diaphoresis and fever.  Eyes: Negative.   Respiratory: Negative for cough, hemoptysis, sputum production, shortness of breath and wheezing.   Cardiovascular: Negative for chest pain.  Gastrointestinal: Negative for abdominal pain, blood in stool, constipation, diarrhea, heartburn, melena, nausea and vomiting.  Genitourinary: Negative for flank pain.  Skin: Negative for rash.  Neurological: Negative for dizziness, sensory change, speech change, focal weakness and headaches.    The problem list and medications were reviewed and updated by myself where necessary and exist elsewhere in the encounter.   OBJECTIVE:  BP (!) 152/78   Pulse 88   Temp 98.4 F (36.9 C) (Oral)   Resp 16   Ht 5\' 4"  (1.626 m) Comment: per pt  Wt 191 lb (86.6 kg) Comment: per pt  SpO2 99%   BMI 32.79 kg/m   Physical Exam  Constitutional: She is active.  Non-toxic appearance.  Cardiovascular: Normal rate, S1 normal and S2 normal. Exam reveals no decreased  pulses.  Pulmonary/Chest: Effort normal. No tachypnea. She has no rales.  Abdominal: She exhibits no distension.  Musculoskeletal: She exhibits no edema.  Neurological: She is alert.  Skin: Skin is warm and dry. She is not diaphoretic. No pallor.    No results found for this or any previous visit (from the past 72 hour(s)).  No results found.  ASSESSMENT AND PLAN:  [name] was seen today for back spasms.  Diagnoses and all orders for this visit:  Low back pain without sciatica, unspecified back pain laterality, unspecified chronicity -     ibuprofen (ADVIL,MOTRIN) 600 MG tablet; Take 1 tablet (600 mg total) by mouth every 8 (eight) hours as needed.  Uncontrolled hypertension -     cyclobenzaprine (FLEXERIL) 10 MG tablet; Take 0.5-1 tablets (5-10 mg total) by mouth 3 (three) times daily as needed.    The patient is advised to call or return to clinic if she does not see an improvement in symptoms, or to seek the care of the closest emergency department if she worsens with the above plan.   Philis Fendt, MHS, PA-C [facility] 01/30/2017 10:13 AM

## 2017-01-30 NOTE — Patient Instructions (Addendum)
  Please take you BP medication  Come back in about 3-4 weeks if you back is still not improving and we will take an image of the back.    IF you received an x-ray today, you will receive an invoice from [facility]. Please contact [facility] at [phone] with questions or concerns regarding your invoice.   IF you received labwork today, you will receive an invoice from [facility]. Please contact [facility] at [phone] with questions or concerns regarding your invoice.   Our billing staff will not be able to assist you with questions regarding bills from these companies.  You will be contacted with the lab results as soon as they are available. The fastest way to get your results is to activate your My Chart account. Instructions are located on the last page of this paperwork. If you have not heard from [place] regarding the results in 2 weeks, please contact this office.

## 2017-03-08 DIAGNOSIS — E669 Obesity, unspecified: Secondary | ICD-10-CM | POA: Diagnosis not present

## 2017-03-08 DIAGNOSIS — J029 Acute pharyngitis, unspecified: Secondary | ICD-10-CM | POA: Diagnosis not present

## 2017-03-08 DIAGNOSIS — I10 Essential (primary) hypertension: Secondary | ICD-10-CM | POA: Diagnosis not present

## 2017-03-08 DIAGNOSIS — R509 Fever, unspecified: Secondary | ICD-10-CM | POA: Diagnosis not present

## 2017-03-08 DIAGNOSIS — K219 Gastro-esophageal reflux disease without esophagitis: Secondary | ICD-10-CM | POA: Diagnosis not present

## 2017-07-27 DIAGNOSIS — K219 Gastro-esophageal reflux disease without esophagitis: Secondary | ICD-10-CM | POA: Diagnosis not present

## 2017-07-27 DIAGNOSIS — I10 Essential (primary) hypertension: Secondary | ICD-10-CM | POA: Diagnosis not present

## 2018-01-20 DIAGNOSIS — Z1231 Encounter for screening mammogram for malignant neoplasm of breast: Secondary | ICD-10-CM | POA: Diagnosis not present

## 2018-01-28 ENCOUNTER — Ambulatory Visit (HOSPITAL_COMMUNITY)
Admission: EM | Admit: 2018-01-28 | Discharge: 2018-01-28 | Disposition: A | Discharge status: Home / Self Care | Payer: Medicare Other | Attending: Family Medicine | Admitting: Family Medicine

## 2018-01-28 DIAGNOSIS — B9789 Other viral agents as the cause of diseases classified elsewhere: Secondary | ICD-10-CM | POA: Diagnosis not present

## 2018-01-28 DIAGNOSIS — J069 Acute upper respiratory infection, unspecified: Secondary | ICD-10-CM | POA: Insufficient documentation

## 2018-01-28 MED ORDER — BENZONATATE 100 MG PO CAPS: 100.0000 mg | ORAL_CAPSULE | Freq: Three times a day (TID) | ORAL | 0 refills | Status: DC

## 2018-01-28 NOTE — Discharge Instructions (Signed)
Please try things such as zyrtec or allegra which is an antihistamine  Please try afrin which will help with nasal congestion but use for only three days.  Please also try using a netti pot on a regular occasion. Honey, vick's vapor rub, lozenges, and a humidifier can help with a cough.

## 2018-01-28 NOTE — ED Provider Notes (Signed)
[facility]    CSN: [id number] Arrival date & time: 01/28/18  1723     History   Chief Complaint Chief Complaint  Patient presents with  . Cough    HPI Sheri Griffith is a 67 y.o. female.   She is presenting with cough congestion for the past 2 days.  Her symptoms seem to be staying the same.  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  Denies any fevers.  Her symptoms seem to be consistent through the day and night.  Has not taken anything for her symptoms.  Denies any recorded fevers.  HPI  Past Medical History:  Diagnosis Date  . Arthritis of knee   . Hypertension   . Sinus problem     Patient Active Problem List   Diagnosis Date Noted  . Abdominal distension (gaseous) 09/04/2016  . Generalized abdominal pain 09/04/2016  . ANXIETY, CHRONIC 06/21/2007  . GERD 06/21/2007  . INTERNAL HEMORRHOIDS 02/07/2001    Past Surgical History:  Procedure Laterality Date  . NONE      OB History   No obstetric history on file.      Home Medications    Prior to Admission medications   Medication Sig Start Date End Date Taking? Authorizing Provider  benzonatate (TESSALON) 100 MG capsule Take 1 capsule (100 mg total) by mouth every 8 (eight) hours. 01/28/18   Rosemarie Ax, MD  cyclobenzaprine (FLEXERIL) 10 MG tablet Take 0.5-1 tablets (5-10 mg total) by mouth 3 (three) times daily as needed. Patient not taking: Reported on 01/28/2018 01/30/17   Tereasa Coop, PA-C  ibuprofen (ADVIL,MOTRIN) 600 MG tablet Take 1 tablet (600 mg total) by mouth every 8 (eight) hours as needed. Patient not taking: Reported on 01/28/2018 01/30/17   Tereasa Coop, PA-C  loratadine (CLARITIN) 10 MG tablet Take 10 mg by mouth daily.    [provider]  losartan (COZAAR) 100 MG tablet Take 100 mg by mouth daily.    [provider]    Family History Family History  Problem Relation Age of Onset  . Other Mother        staph infection  . Other Father         died age 65  . Colon cancer Neg Hx   . Rectal cancer Neg Hx   . Esophageal cancer Neg Hx   . Liver cancer Neg Hx     Social History Social History   Tobacco Use  . Smoking status: Never Smoker  . Smokeless tobacco: Never Used  Substance Use Topics  . Alcohol use: Yes    Comment: occasional use  . Drug use: No     Allergies   Procaine hcl   Review of Systems Review of Systems  Constitutional: Negative for fever.  HENT: Positive for congestion and rhinorrhea.   Respiratory: Positive for cough.   Cardiovascular: Negative for chest pain.  Gastrointestinal: Negative for abdominal pain.  Musculoskeletal: Negative for arthralgias.     Physical Exam Triage Vital Signs ED Triage Vitals  Enc Vitals Group     BP 01/28/18 1851 (!) 184/92     Pulse Rate 01/28/18 1850 100     Resp 01/28/18 1850 18     Temp 01/28/18 1850 97.8 F (36.6 C)     Temp Source 01/28/18 1850 Temporal     SpO2 01/28/18 1850 97 %     Weight --      Height --  Head Circumference --      Peak Flow --      Pain Score 01/28/18 1852 0     Pain Loc --      Pain Edu? --      Excl. in [place]? --    No data found.  Updated Vital Signs BP (!) 184/92   Pulse 100   Temp 97.8 F (36.6 C) (Temporal)   Resp 18   SpO2 97%   Visual Acuity Right Eye Distance:   Left Eye Distance:   Bilateral Distance:    Right Eye Near:   Left Eye Near:    Bilateral Near:     Physical Exam Gen: NAD, alert, cooperative with exam, well-appearing ENT: normal lips, normal nasal mucosa, tympanic membranes clear and intact bilaterally, normal oropharynx, no cervical lymphadenopathy Eye: normal EOM, normal conjunctiva and lids CV:  no edema, +2 pedal pulses   Resp: no accessory muscle use, non-labored, clear to auscultation bilaterally, no crackles or wheezes Skin: no rashes, no areas of induration  Neuro: normal tone, normal sensation to touch Psych:  normal insight, alert and oriented MSK: Normal gait,  normal tone   UC Treatments / Results  Labs (all labs ordered are listed, but only abnormal results are displayed) Labs Reviewed - No data to display  EKG None  Radiology No results found.  Procedures Procedures (including critical care time)  Medications Ordered in UC Medications - No data to display  Initial Impression / Assessment and Plan / UC Course  I have reviewed the triage vital signs and the nursing notes.  Pertinent labs & imaging results that were available during my care of the patient were reviewed by me and considered in my medical decision making (see chart for details).     Sheri Griffith is a 67 year old female is presenting with symptoms suggestive of a URI secondary to a viral illness.  Denies any fevers.  Has had sick contacts at her work.  Counseled on supportive care and provided Tessalon Perles.  Given indications to follow-up and return.  Final Clinical Impressions(s) / UC Diagnoses   Final diagnoses:  Viral URI with cough     Discharge Instructions     Please try things such as zyrtec or allegra which is an antihistamine  Please try afrin which will help with nasal congestion but use for only three days.  Please also try using a netti pot on a regular occasion. Honey, vick's vapor rub, lozenges, and a humidifier can help with a cough.      ED Prescriptions    Medication Sig Dispense Auth. Provider   benzonatate (TESSALON) 100 MG capsule Take 1 capsule (100 mg total) by mouth every 8 (eight) hours. 21 capsule Rosemarie Ax, MD     Controlled Substance Prescriptions [place] Controlled Substance Registry consulted? Not Applicable   Rosemarie Ax, MD 01/28/18 2150

## 2018-01-28 NOTE — ED Triage Notes (Signed)
Pt c/o cough x2 days, states "I want to know what I should be takign".

## 2018-01-29 ENCOUNTER — Telehealth (HOSPITAL_COMMUNITY): Payer: Self-pay | Admitting: Emergency Medicine

## 2018-01-29 NOTE — Telephone Encounter (Signed)
Received a message that patient had called with question.  Called patient at [phone].  Call went to voice mail.  Left message that ucc was calling

## 2018-01-30 ENCOUNTER — Telehealth (HOSPITAL_COMMUNITY): Payer: Self-pay | Admitting: *Deleted

## 2018-01-30 NOTE — Telephone Encounter (Signed)
Pt states she is unable to take the Rx benzonatate "because I looked it up, and it can cause a rash around the mouth, and that's happened to me".  Discussed with Dr Gwendlyn Deutscher - recommended pt use OTC Delsym or dextromethorphan; re-reviewed pt's original discharge instructions recommending antihistamine, Afrin, saline rinses.  Pt verbalized understanding of all.

## 2018-10-12 DIAGNOSIS — H43811 Vitreous degeneration, right eye: Secondary | ICD-10-CM | POA: Diagnosis not present

## 2018-10-12 DIAGNOSIS — H25013 Cortical age-related cataract, bilateral: Secondary | ICD-10-CM | POA: Diagnosis not present

## 2018-10-12 DIAGNOSIS — H2513 Age-related nuclear cataract, bilateral: Secondary | ICD-10-CM | POA: Diagnosis not present

## 2019-03-30 ENCOUNTER — Telehealth: Payer: Self-pay | Admitting: Family Medicine

## 2019-03-30 NOTE — Telephone Encounter (Signed)
Left VM for patient. If she calls back please have her speak with a nurse/CMA and ask if she has any questions.   If any questions then please take the best time and phone number to call and I will try to call her back.   Rosemarie Ax, MD [facility] 03/30/2019, 5:22 PM

## 2019-04-18 ENCOUNTER — Other Ambulatory Visit: Payer: Self-pay

## 2019-04-18 ENCOUNTER — Ambulatory Visit (INDEPENDENT_AMBULATORY_CARE_PROVIDER_SITE_OTHER): Payer: Medicare Other | Admitting: Adult Health Nurse Practitioner

## 2019-04-18 DIAGNOSIS — Z1211 Encounter for screening for malignant neoplasm of colon: Secondary | ICD-10-CM

## 2019-04-18 DIAGNOSIS — M542 Cervicalgia: Secondary | ICD-10-CM

## 2019-04-18 DIAGNOSIS — M5386 Other specified dorsopathies, lumbar region: Secondary | ICD-10-CM

## 2019-04-18 DIAGNOSIS — I10 Essential (primary) hypertension: Secondary | ICD-10-CM

## 2019-04-18 DIAGNOSIS — M7071 Other bursitis of hip, right hip: Secondary | ICD-10-CM

## 2019-04-18 MED ORDER — CYCLOBENZAPRINE HCL 10 MG PO TABS: 5.0000 mg | ORAL_TABLET | Freq: Three times a day (TID) | ORAL | 0 refills | Status: DC | PRN

## 2019-04-18 MED ORDER — CYCLOBENZAPRINE HCL 10 MG PO TABS: 10.0000 mg | ORAL_TABLET | Freq: Three times a day (TID) | ORAL | 0 refills | Status: DC | PRN

## 2019-04-18 MED ORDER — METHYLPREDNISOLONE 4 MG PO TBPK: ORAL_TABLET | ORAL | 0 refills | Status: DC

## 2019-04-18 NOTE — Patient Instructions (Addendum)
Sciatica Rehab Ask your health care provider which exercises are safe for you. Do exercises exactly as told by your health care provider and adjust them as directed. It is normal to feel mild stretching, pulling, tightness, or discomfort as you do these exercises. Stop right away if you feel sudden pain or your pain gets worse. Do not begin these exercises until told by your health care provider. Stretching and range-of-motion exercises These exercises warm up your muscles and joints and improve the movement and flexibility of your hips and back. These exercises also help to relieve pain, numbness, and tingling. Sciatic nerve glide 1. Sit in a chair with your head facing down toward your chest. Place your hands behind your back. Let your shoulders slump forward. 2. Slowly straighten one of your legs while you tilt your head back as if you are looking toward the ceiling. Only straighten your leg as far as you can without making your symptoms worse. 3. Hold this position for __________ seconds. 4. Slowly return to the starting position. 5. Repeat with your other leg. Repeat __________ times. Complete this exercise __________ times a day. Knee to chest with hip adduction and internal rotation  1. Lie on your back on a firm surface with both legs straight. 2. Bend one of your knees and move it up toward your chest until you feel a gentle stretch in your lower back and buttock. Then, move your knee toward the shoulder that is on the opposite side from your leg. This is hip adduction and internal rotation. ? Hold your leg in this position by holding on to the front of your knee. 3. Hold this position for __________ seconds. 4. Slowly return to the starting position. 5. Repeat with your other leg. Repeat __________ times. Complete this exercise __________ times a day. Prone extension on elbows  1. Lie on your abdomen on a firm surface. A bed may be too soft for this exercise. 2. Prop yourself  up on your elbows. 3. Use your arms to help lift your chest up until you feel a gentle stretch in your abdomen and your lower back. ? This will place some of your body weight on your elbows. If this is uncomfortable, try stacking pillows under your chest. ? Your hips should stay down, against the surface that you are lying on. Keep your hip and back muscles relaxed. 4. Hold this position for __________ seconds. 5. Slowly relax your upper body and return to the starting position. Repeat __________ times. Complete this exercise __________ times a day. Strengthening exercises These exercises build strength and endurance in your back. Endurance is the ability to use your muscles for a long time, even after they get tired. Pelvic tilt This exercise strengthens the muscles that lie deep in the abdomen. 1. Lie on your back on a firm surface. Bend your knees and keep your feet flat on the floor. 2. Tense your abdominal muscles. Tip your pelvis up toward the ceiling and flatten your lower back into the floor. ? To help with this exercise, you may place a small towel under your lower back and try to push your back into the towel. 3. Hold this position for __________ seconds. 4. Let your muscles relax completely before you repeat this exercise. Repeat __________ times. Complete this exercise __________ times a day. Alternating arm and leg raises  1. Get on your hands and knees on a firm surface. If you are on a hard floor, you may want  to use padding, such as an exercise mat, to cushion your knees. 2. Line up your arms and legs. Your hands should be directly below your shoulders, and your knees should be directly below your hips. 3. Lift your left leg behind you. At the same time, raise your right arm and straighten it in front of you. ? Do not lift your leg higher than your hip. ? Do not lift your arm higher than your shoulder. ? Keep your abdominal and back muscles tight. ? Keep your hips facing the  ground. ? Do not arch your back. ? Keep your balance carefully, and do not hold your breath. 4. Hold this position for __________ seconds. 5. Slowly return to the starting position. 6. Repeat with your right leg and your left arm. Repeat __________ times. Complete this exercise __________ times a day. Posture and body mechanics Good posture and healthy body mechanics can help to relieve stress in your body's tissues and joints. Body mechanics refers to the movements and positions of your body while you do your daily activities. Posture is part of body mechanics. Good posture means:  Your spine is in its natural S-curve position (neutral).  Your shoulders are pulled back slightly.  Your head is not tipped forward. Follow these guidelines to improve your posture and body mechanics in your everyday activities. Standing   When standing, keep your spine neutral and your feet about hip width apart. Keep a slight bend in your knees. Your ears, shoulders, and hips should line up.  When you do a task in which you stand in one place for a long time, place one foot up on a stable object that is 2-4 inches (5-10 cm) high, such as a footstool. This helps keep your spine neutral. Sitting   When sitting, keep your spine neutral and keep your feet flat on the floor. Use a footrest, if necessary, and keep your thighs parallel to the floor. Avoid rounding your shoulders, and avoid tilting your head forward.  When working at a desk or a computer, keep your desk at a height where your hands are slightly lower than your elbows. Slide your chair under your desk so you are close enough to maintain good posture.  When working at a computer, place your monitor at a height where you are looking straight ahead and you do not have to tilt your head forward or downward to look at the screen. Resting  When lying down and resting, avoid positions that are most painful for you.  If you have pain with activities  such as sitting, bending, stooping, or squatting, lie in a position in which your body does not bend very much. For example, avoid curling up on your side with your arms and knees near your chest (fetal position).  If you have pain with activities such as standing for a long time or reaching with your arms, lie with your spine in a neutral position and bend your knees slightly. Try the following positions: ? Lying on your side with a pillow between your knees. ? Lying on your back with a pillow under your knees. Lifting   When lifting objects, keep your feet at least shoulder width apart and tighten your abdominal muscles.  Bend your knees and hips and keep your spine neutral. It is important to lift using the strength of your legs, not your back. Do not lock your knees straight out.  Always ask for help to lift heavy or awkward objects. This information  is not intended to replace advice given to you by your health care provider. Make sure you discuss any questions you have with your health care provider. Document Revised: 05/13/2018 Document Reviewed: 02/10/2018 Elsevier Patient Education  [place].  Sciatica Rehab Ask your health care provider which exercises are safe for you. Do exercises exactly as told by your health care provider and adjust them as directed. It is normal to feel mild stretching, pulling, tightness, or discomfort as you do these exercises. Stop right away if you feel sudden pain or your pain gets worse. Do not begin these exercises until told by your health care provider. Stretching and range-of-motion exercises These exercises warm up your muscles and joints and improve the movement and flexibility of your hips and back. These exercises also help to relieve pain, numbness, and tingling. Sciatic nerve glide 6. Sit in a chair with your head facing down toward your chest. Place your hands behind your back. Let your shoulders slump forward. 7. Slowly straighten one  of your legs while you tilt your head back as if you are looking toward the ceiling. Only straighten your leg as far as you can without making your symptoms worse. 8. Hold this position for __________ seconds. 9. Slowly return to the starting position. 10. Repeat with your other leg. Repeat __________ times. Complete this exercise __________ times a day. Knee to chest with hip adduction and internal rotation  6. Lie on your back on a firm surface with both legs straight. 7. Bend one of your knees and move it up toward your chest until you feel a gentle stretch in your lower back and buttock. Then, move your knee toward the shoulder that is on the opposite side from your leg. This is hip adduction and internal rotation. ? Hold your leg in this position by holding on to the front of your knee. 8. Hold this position for __________ seconds. 9. Slowly return to the starting position. 10. Repeat with your other leg. Repeat __________ times. Complete this exercise __________ times a day. Prone extension on elbows  6. Lie on your abdomen on a firm surface. A bed may be too soft for this exercise. 7. Prop yourself up on your elbows. 8. Use your arms to help lift your chest up until you feel a gentle stretch in your abdomen and your lower back. ? This will place some of your body weight on your elbows. If this is uncomfortable, try stacking pillows under your chest. ? Your hips should stay down, against the surface that you are lying on. Keep your hip and back muscles relaxed. 9. Hold this position for __________ seconds. 10. Slowly relax your upper body and return to the starting position. Repeat __________ times. Complete this exercise __________ times a day. Strengthening exercises These exercises build strength and endurance in your back. Endurance is the ability to use your muscles for a long time, even after they get tired. Pelvic tilt This exercise strengthens the muscles that lie deep in the  abdomen. 5. Lie on your back on a firm surface. Bend your knees and keep your feet flat on the floor. 6. Tense your abdominal muscles. Tip your pelvis up toward the ceiling and flatten your lower back into the floor. ? To help with this exercise, you may place a small towel under your lower back and try to push your back into the towel. 7. Hold this position for __________ seconds. 8. Let your muscles relax completely before you  repeat this exercise. Repeat __________ times. Complete this exercise __________ times a day. Alternating arm and leg raises  7. Get on your hands and knees on a firm surface. If you are on a hard floor, you may want to use padding, such as an exercise mat, to cushion your knees. 8. Line up your arms and legs. Your hands should be directly below your shoulders, and your knees should be directly below your hips. 9. Lift your left leg behind you. At the same time, raise your right arm and straighten it in front of you. ? Do not lift your leg higher than your hip. ? Do not lift your arm higher than your shoulder. ? Keep your abdominal and back muscles tight. ? Keep your hips facing the ground. ? Do not arch your back. ? Keep your balance carefully, and do not hold your breath. 10. Hold this position for __________ seconds. 11. Slowly return to the starting position. 12. Repeat with your right leg and your left arm. Repeat __________ times. Complete this exercise __________ times a day. Posture and body mechanics Good posture and healthy body mechanics can help to relieve stress in your body's tissues and joints. Body mechanics refers to the movements and positions of your body while you do your daily activities. Posture is part of body mechanics. Good posture means:  Your spine is in its natural S-curve position (neutral).  Your shoulders are pulled back slightly.  Your head is not tipped forward. Follow these guidelines to improve your posture and body mechanics  in your everyday activities. Standing   When standing, keep your spine neutral and your feet about hip width apart. Keep a slight bend in your knees. Your ears, shoulders, and hips should line up.  When you do a task in which you stand in one place for a long time, place one foot up on a stable object that is 2-4 inches (5-10 cm) high, such as a footstool. This helps keep your spine neutral. Sitting   When sitting, keep your spine neutral and keep your feet flat on the floor. Use a footrest, if necessary, and keep your thighs parallel to the floor. Avoid rounding your shoulders, and avoid tilting your head forward.  When working at a desk or a computer, keep your desk at a height where your hands are slightly lower than your elbows. Slide your chair under your desk so you are close enough to maintain good posture.  When working at a computer, place your monitor at a height where you are looking straight ahead and you do not have to tilt your head forward or downward to look at the screen. Resting  When lying down and resting, avoid positions that are most painful for you.  If you have pain with activities such as sitting, bending, stooping, or squatting, lie in a position in which your body does not bend very much. For example, avoid curling up on your side with your arms and knees near your chest (fetal position).  If you have pain with activities such as standing for a long time or reaching with your arms, lie with your spine in a neutral position and bend your knees slightly. Try the following positions: ? Lying on your side with a pillow between your knees. ? Lying on your back with a pillow under your knees. Lifting   When lifting objects, keep your feet at least shoulder width apart and tighten your abdominal muscles.  Bend your knees and hips  and keep your spine neutral. It is important to lift using the strength of your legs, not your back. Do not lock your knees straight  out.  Always ask for help to lift heavy or awkward objects. This information is not intended to replace advice given to you by your health care provider. Make sure you discuss any questions you have with your health care provider. Document Revised: 05/13/2018 Document Reviewed: 02/10/2018 Elsevier Patient Education  [place].

## 2019-05-08 ENCOUNTER — Ambulatory Visit: Payer: Medicare Other

## 2019-05-09 ENCOUNTER — Other Ambulatory Visit: Payer: Self-pay

## 2019-05-09 ENCOUNTER — Encounter: Payer: Self-pay | Admitting: Physical Therapy

## 2019-05-09 ENCOUNTER — Ambulatory Visit: Payer: Medicare Other | Attending: Adult Health Nurse Practitioner | Admitting: Physical Therapy

## 2019-05-09 DIAGNOSIS — M25551 Pain in right hip: Secondary | ICD-10-CM

## 2019-05-09 DIAGNOSIS — M542 Cervicalgia: Secondary | ICD-10-CM | POA: Insufficient documentation

## 2019-05-09 DIAGNOSIS — R293 Abnormal posture: Secondary | ICD-10-CM | POA: Diagnosis present

## 2019-05-09 DIAGNOSIS — M6281 Muscle weakness (generalized): Secondary | ICD-10-CM | POA: Insufficient documentation

## 2019-05-09 NOTE — Therapy (Signed)
[place] [address] [place], [place], [address] Phone: [phone]   Fax:  [phone]  Physical Therapy Evaluation  Patient Details  Name: Sheri Griffith MRN: [id number] Date of Birth: 10-11-1950 Referring Provider (PT): Hazle Nordmann NP   Encounter Date: 05/09/2019  PT End of Session - 05/09/19 1053    Visit Number  1    Number of Visits  12    Date for PT Re-Evaluation  06/20/19    Authorization Type  [employer]    Authorization Time Period  p note at 10th visit    PT Start Time  1100    PT Stop Time  1149    PT Time Calculation (min)  49 min    Activity Tolerance  Patient tolerated treatment well    Behavior During Therapy  [facility] for tasks assessed/performed       Past Medical History:  Diagnosis Date  . Arthritis of knee   . Hypertension   . Sinus problem     Past Surgical History:  Procedure Laterality Date  . NONE      There were no vitals filed for this visit.   Subjective Assessment - 05/09/19 1057    Subjective  Pt presents with reports of low back, neck and Rt hip pain that started about a month ago.  She was sitting in an office chair leaning on her lap top for over an hour and was sore after that.  She has been doing some gentle exercise and stretches to help it.  She is wondering if her sciatic is hurting because her pain is into the lateral lower leg now.    Currently in Pain?  Yes    Pain Location  Leg    Pain Orientation  Right    Pain Descriptors / Indicators  Sharp;Shooting    Pain Type  Acute pain    Pain Radiating Towards  into lower leg    Pain Onset  1 to 4 weeks ago    Pain Frequency  Constant   intensity varies with movement   Aggravating Factors   moving the leg out to the Rt, lifting leg onto bed    Pain Relieving Factors  gentle stretching helps her, heat         [facility] PT Assessment - 05/09/19 0001      Assessment   Medical Diagnosis  low back , Rt LE pain and neck pain    Referring Provider (PT)  Hazle Nordmann NP    Onset Date/Surgical Date  04/08/19    Hand Dominance  Right    Next MD Visit  04/14/2019    Prior Therapy  not for this      Precautions   Precautions  None      Balance Screen   Has the patient fallen in the past 6 months  No    Has the patient had a decrease in activity level because of a fear of falling?   No    Is the patient reluctant to leave their home because of a fear of falling?   No      Home Environment   Living Environment  Private residence    Living Arrangements  Spouse/significant other    Home Layout  Two level   difficult      Prior Function   Level of Independence  Independent   slowly   Vocation  Unemployed   substitute teacher  Leisure  walking , reading, hike easy      Observation/Other Assessments   Focus on Therapeutic Outcomes (FOTO)   59% limited      Posture/Postural Control   Posture/Postural Control  Postural limitations    Postural Limitations  Forward head;Rounded Shoulders;Increased thoracic kyphosis   extra abdominal girth     ROM / Strength   AROM / PROM / Strength  AROM;Strength      AROM   AROM Assessment Site  Cervical;Shoulder;Lumbar    Right/Left Shoulder  --   WNL   Cervical Flexion  to chest     Cervical Extension  12    Cervical - Right Rotation  52    Cervical - Left Rotation  62    Lumbar Flexion  almost to floor, slight Rt LE pain     Lumbar Extension  WNL    Lumbar - Right Rotation  WNL    Lumbar - Left Rotation  WNL      Strength   Strength Assessment Site  Shoulder;Elbow;Hip;Knee;Ankle    Right/Left Shoulder  --   WNL except ER 4-/5, IR 4/5   Right/Left Elbow  --   5/5   Right/Left Hip  --   flex 5/5, abduction Rt 4-/5, Lt 4+/5, ext  4/5   Right/Left Knee  --   5/5   Right/Left Ankle  --   5/5     Flexibility   Soft Tissue Assessment /Muscle Length  yes    Hamstrings  WNL   tight Rt gluts    Quadriceps  prone knee flex Lt 118,, Rt 110    ITB  tight Rt        Palpation   Spinal mobility  NA at eval    Palpation comment  hypersenstive in Rt lateral hip, ITB, gluts and thoracic paraspinals tight                 Objective measurements completed on examination: See above findings.      [facility] PT Treatment/Exercise - 05/09/19 0001      Exercises   Exercises  Other Exercises    Other Exercises   SKTC, piriformis and thoracic openers lying over noodle             PT Education - 05/09/19 1355    Education Details  HEP and POC    Person(s) Educated  Patient    Methods  Explanation;Demonstration;Handout    Comprehension  Returned demonstration;Verbalized understanding          PT Long Term Goals - 05/09/19 1403      PT LONG TERM GOAL #1   Title  I with advanced HEP ( 06/20/2019)    Time  6    Period  Weeks    Status  New    Target Date  06/20/19      PT LONG TERM GOAL #2   Title  report the ability to perform her ADLs at her prior level of function with minimal to no pain ( 06/20/2019)    Time  6    Period  Weeks    Status  New    Target Date  06/20/19      PT LONG TERM GOAL #3   Title  demo bilat hip and shoulder strength =/> 4+/5 to allow her to ambulate up/down stairs without difficulty ( 5/18/20201)    Time  6    Period  Weeks    Status  New  Target Date  06/20/19      PT LONG TERM GOAL #4   Title  improve FOTO =/< 40% limited ( 06/20/2019)    Time  6    Period  Weeks    Status  New    Target Date  06/20/19      PT LONG TERM GOAL #5   Title  report =/> 75% reduction in Rt hip pain to allow her to move it in/out of bed and car without difficulty ( 06/20/2019)    Time  6    Period  Weeks    Status  New    Target Date  06/20/19             Plan - 05/09/19 1358    Clinical Impression Statement  69 yo female presents with low back, Rt hip and neck pain. This has been going on for about 4 months now and started after she was leaning FWD while working on a computer for over an hour.  She has  some stiffness in her neck and back, reports having to walk and perform ADLs much slower d/t pain.  Most of her pain is currently in the Rt hip and lateral thigh.  She does have some hip, shoulder and core weakness. Hypersensitivity to palpation around the Rt lateral hip, thigh and gluts.  Difficult to separate out bursitis pain from muscular pain at this time.  She would benefit from PT to address her deficts, reduce pain and restore normal activities.    Personal Factors and Comorbidities  Comorbidity 2    Comorbidities  HTN, arthirtis    Examination-Activity Limitations  Bathing;Bed Mobility;Stairs    Examination-Participation Restrictions  Cleaning;Community Activity;Other    Stability/Clinical Decision Making  Stable/Uncomplicated    Clinical Decision Making  Low    Rehab Potential  Good    PT Frequency  2x / week    PT Duration  6 weeks    PT Treatment/Interventions  Iontophoresis 4mg /ml Dexamethasone;Stair training;Taping;Patient/family education;Functional mobility training;Moist Heat;Ultrasound;Cryotherapy;Electrical Stimulation;Manual techniques;Therapeutic exercise;Dry needling    PT Next Visit Plan  postural correction, back of the body stregnthening, possible ionto to Rt hip    Consulted and Agree with Plan of Care  Patient       Patient will benefit from skilled therapeutic intervention in order to improve the following deficits and impairments:  Decreased range of motion, Difficulty walking, Pain, Decreased strength, Postural dysfunction  Visit Diagnosis: Pain in right hip - Plan: PT plan of care cert/re-cert  Muscle weakness (generalized) - Plan: PT plan of care cert/re-cert  Cervicalgia - Plan: PT plan of care cert/re-cert  Abnormal posture - Plan: PT plan of care cert/re-cert     Problem List Patient Active Problem List   Diagnosis Date Noted  . Abdominal distension (gaseous) 09/04/2016  . Generalized abdominal pain 09/04/2016  . ANXIETY, CHRONIC 06/21/2007  .  GERD 06/21/2007  . INTERNAL HEMORRHOIDS 02/07/2001    Jeral Pinch PT  05/09/2019, 2:08 PM  [place] [address] [place], [place], [address] Phone: [phone]   Fax:  [phone]  Name: MAYSEL MCCARRON MRN: [id number] Date of Birth: [birth year]

## 2019-05-09 NOTE — Patient Instructions (Signed)
Access Code: [email or web address] URL: [email or web address] Date: 05/09/2019 Prepared by: Jeral Pinch  Exercises Supine Figure 4 Piriformis Stretch - 1 x daily - 1 sets - 2-3 reps - 20-30 hold Hooklying Single Knee to Chest Stretch - 1 x daily - 1 sets - 2-3 reps - 20-30 hold Supine Thoracic Mobilization Foam Roll Horizontal with Arm Stretch - 1 x daily - 2-3 sets - 10 reps

## 2019-05-16 ENCOUNTER — Encounter: Payer: Self-pay | Admitting: Adult Health Nurse Practitioner

## 2019-05-16 ENCOUNTER — Encounter: Payer: Self-pay | Admitting: Physical Therapy

## 2019-05-16 ENCOUNTER — Ambulatory Visit (INDEPENDENT_AMBULATORY_CARE_PROVIDER_SITE_OTHER): Payer: Medicare Other | Admitting: Adult Health Nurse Practitioner

## 2019-05-16 ENCOUNTER — Other Ambulatory Visit: Payer: Self-pay

## 2019-05-16 ENCOUNTER — Ambulatory Visit: Payer: Medicare Other | Admitting: Physical Therapy

## 2019-05-16 VITALS — BP 121/82 | HR 72 | Temp 97.6°F | Ht 64.0 in | Wt 197.0 lb

## 2019-05-16 DIAGNOSIS — M25551 Pain in right hip: Secondary | ICD-10-CM

## 2019-05-16 DIAGNOSIS — Z1211 Encounter for screening for malignant neoplasm of colon: Secondary | ICD-10-CM | POA: Diagnosis not present

## 2019-05-16 DIAGNOSIS — Z13820 Encounter for screening for osteoporosis: Secondary | ICD-10-CM

## 2019-05-16 DIAGNOSIS — M6281 Muscle weakness (generalized): Secondary | ICD-10-CM

## 2019-05-16 DIAGNOSIS — M7070 Other bursitis of hip, unspecified hip: Secondary | ICD-10-CM | POA: Insufficient documentation

## 2019-05-16 DIAGNOSIS — Z0000 Encounter for general adult medical examination without abnormal findings: Secondary | ICD-10-CM

## 2019-05-16 DIAGNOSIS — I10 Essential (primary) hypertension: Secondary | ICD-10-CM

## 2019-05-16 DIAGNOSIS — Z1231 Encounter for screening mammogram for malignant neoplasm of breast: Secondary | ICD-10-CM

## 2019-05-16 DIAGNOSIS — M7071 Other bursitis of hip, right hip: Secondary | ICD-10-CM | POA: Diagnosis not present

## 2019-05-16 DIAGNOSIS — M542 Cervicalgia: Secondary | ICD-10-CM

## 2019-05-16 DIAGNOSIS — Z23 Encounter for immunization: Secondary | ICD-10-CM | POA: Diagnosis not present

## 2019-05-16 DIAGNOSIS — R293 Abnormal posture: Secondary | ICD-10-CM

## 2019-05-16 NOTE — Patient Instructions (Signed)
° ° ° °  If you have lab work done today you will be contacted with your lab results within the next 2 weeks.  If you have not heard from us then please contact us. The fastest way to get your results is to register for My Chart. ° ° °IF you received an x-ray today, you will receive an invoice from [facility]. Please contact [facility] at [phone] with questions or concerns regarding your invoice.  ° °IF you received labwork today, you will receive an invoice from [facility]. Please contact [facility] at [phone] with questions or concerns regarding your invoice.  ° °Our billing staff will not be able to assist you with questions regarding bills from these companies. ° °You will be contacted with the lab results as soon as they are available. The fastest way to get your results is to activate your My Chart account. Instructions are located on the last page of this paperwork. If you have not heard from us regarding the results in 2 weeks, please contact this office. °  ° ° ° °

## 2019-05-16 NOTE — Progress Notes (Signed)
History   Chief Complaint  Patient presents with  . Follow-up    health screenings    HPI  Patient has been going to PT and feels it is helping.  Is able to move around better with less pain to her hip and lower back.  She is taking Motrin intermitently for pain to hip.  Steroid worked well.  She is currently taking the Flexeril intermittently.  We discussed a referral for ortho in the [place] that the injection did not improve her pain.  She is amenable to that.   BP is much better today.  Has not been getting high readings at home.  It could have been related to the pain she was in but unsure.    We discussed health screenings and she would like orders for Cologard, mammogram, etc.  No blood work needed at this time.                                                                   Past Medical History:  Diagnosis Date  . Arthritis of knee   . Hypertension   . Sinus problem    Past Surgical History:  Procedure Laterality Date  . NONE     Family History  Problem Relation Age of Onset  . Other Mother        staph infection  . Other Father        died age 41  . Colon cancer Neg Hx   . Rectal cancer Neg Hx   . Esophageal cancer Neg Hx   . Liver cancer Neg Hx    Social History   Tobacco Use  . Smoking status: Never Smoker  . Smokeless tobacco: Never Used  Substance Use Topics  . Alcohol use: Yes    Comment: occasional use  . Drug use: No   OB History   No obstetric history on file.    Review of Systems  Allergies   Procaine hcl, Cortisone, and Lidocaine  Home Medications    Current Outpatient Medications:  .  amoxicillin (AMOXIL) 500 MG tablet, Take 500 mg by mouth 2 (two) times daily., Disp: , Rfl:  .  cyclobenzaprine (FLEXERIL) 10 MG tablet, Take 1 tablet (10 mg total) by mouth 3 (three) times daily as needed for muscle spasms., Disp: 30 tablet, Rfl: 0 .  ibuprofen (ADVIL) 200 MG tablet, Take 200 mg by mouth every 6 (six) hours as needed., Disp: , Rfl:   .  losartan (COZAAR) 100 MG tablet, Take 100 mg by mouth daily., Disp: , Rfl:   Meds Ordered and Administered this Visit  No orders of the defined types were placed in this encounter.   BP 121/82   Pulse 72   Temp 97.6 F (36.4 C)   Ht 5\' 4"  (1.626 m)   Wt 197 lb (89.4 kg)   SpO2 99%   BMI 33.81 kg/m   Physical Exam  General appearance: alert, well appearing, and in no distress, oriented to person, place, and time and anxious. Chest: clear to auscultation, no wheezes, rales or rhonchi, symmetric air entry.  CVS exam: normal rate, regular rhythm, normal S1, S2, no murmurs, rubs, clicks or gallops. Skin exam - normal coloration and turgor, no rashes, no suspicious skin lesions  noted. Mental Status: normal mood, behavior, speech, dress, motor activity, and thought processes, anxious. MSK:  Gait normalized.  Not antalgic. No instability weaknss    MDM   1. Screening for colon cancer   2. Need for prophylactic vaccination against Streptococcus pneumoniae (pneumococcus)   3. Encounter for screening mammogram for malignant neoplasm of breast   4. Screening for osteoporosis   5. Screening for metabolic disorder   6. Routine health maintenance   7. Bursitis of other bursa of right hip   8. Uncontrolled hypertension     Will bring BP cuff when she returns to calibrate against a manual pressure.  She is inline with this plan.   Glyn Ade, NP

## 2019-05-16 NOTE — Progress Notes (Signed)
Chief Complaint  Patient presents with  . Establish Care    Pt stated that her hamstring in her Rt leg has been hurting fot the past 3 weeks. She didi not do anything to her it as she knows of.    HPI   Patient having pain to her right lower extremity x 3 weeks.  Hurts in the hamstring region.  No preceding injury. Upon further discussion, pain is radiating from her lower back posteriorly to right lower extremity in an S1 distribution.  In addition, has some cervical pain as well.    For lower back and leg, pain radiates across right hip as well.   Problem List    Problem List: 2018-08: Abdominal distension (gaseous) 2018-08: Generalized abdominal pain 2009-05: ANXIETY, CHRONIC 2009-05: GERD 2003-01: INTERNAL HEMORRHOIDS   Allergies   is allergic to procaine hcl; cortisone; and lidocaine.  Medications    Current Outpatient Medications:  .  amoxicillin (AMOXIL) 500 MG tablet, Take 500 mg by mouth 2 (two) times daily., Disp: , Rfl:  .  cyclobenzaprine (FLEXERIL) 10 MG tablet, Take 1 tablet (10 mg total) by mouth 3 (three) times daily as needed for muscle spasms., Disp: 30 tablet, Rfl: 0 .  ibuprofen (ADVIL) 200 MG tablet, Take 200 mg by mouth every 6 (six) hours as needed., Disp: , Rfl:  .  losartan (COZAAR) 100 MG tablet, Take 100 mg by mouth daily., Disp: , Rfl:    Review of Systems    Constitutional: Negative for activity change, appetite change, chills and fever.  HENT: Negative for congestion, nosebleeds, trouble swallowing and voice change.   Respiratory: Negative for cough, shortness of breath and wheezing.   Cardiac:  Negative for chest pain, pressure, syncope  Gastrointestinal: Negative for diarrhea, nausea and vomiting.  Genitourinary: Negative for difficulty urinating, dysuria, flank pain and hematuria.  Musculoskeletal: Negative for back pain, joint swelling and neck pain.  Neurological: Negative for dizziness, speech difficulty, light-headedness and numbness.    See HPI. All other review of systems negative.     Physical Exam:    vitals were not taken for this visit.   Physical Examination: General appearance - alert, well appearing, and in no distress and oriented to person, place, and time Mental status - normal mood, behavior, speech, dress, motor activity, and thought processes Eyes - PERRL. Extraocular movements intact.  No nystagmus.  Neck - supple, no significant adenopathy, carotids upstroke normal bilaterally, no bruits, thyroid exam: thyroid is normal in size without nodules or tenderness Chest - clear to auscultation, no wheezes, rales or rhonchi, symmetric air entry  Heart - normal rate, regular rhythm, normal S1, S2, no murmurs, rubs, clicks or gallops Extremities - dependent LE edema without clubbing or cyanosis Skin - normal coloration and turgor, no rashes, no suspicious skin lesions noted  MSK:  Positive straight leg raise at 45 degrees on the right with pain palpated to right hip bursa.  5/5 lower extremity strength.  Sensation intact.  No hyperpigmentation of skin.  No current hematomas noted   Lab /Imaging Review    no lab studies available for review at time of visit.   Assessment & Plan:  Sheri Griffith is a 69 y.o. female    1. Screening for colon cancer   2. Uncontrolled hypertension   3. Bursitis of other bursa of right hip   4. Pain of cervical facet joint   5. Sciatica of left side associated with disorder of lumbar spine    Orders  Placed This Encounter  Procedures  . Ambulatory referral to Gastroenterology  . Ambulatory referral to Physical Therapy   Meds ordered this encounter  Medications  . DISCONTD: methylPREDNISolone (MEDROL DOSEPAK) 4 MG TBPK tablet    Sig: As directed on pkg label    Dispense:  21 each    Refill:  0  . cyclobenzaprine (FLEXERIL) 10 MG tablet    Sig: Take 1 tablet (10 mg total) by mouth 3 (three) times daily as needed for muscle spasms.    Dispense:  30 tablet    Refill:  0  .  DISCONTD: cyclobenzaprine (FLEXERIL) 10 MG tablet    Sig: Take 0.5-1 tablets (5-10 mg total) by mouth 3 (three) times daily as needed.    Dispense:  30 tablet    Refill:  0     Glyn Ade, NP

## 2019-05-16 NOTE — Therapy (Signed)
[address] [place], [place], [address] Phone: [phone]   Fax:  [phone]  Physical Therapy Treatment  Patient Details  Name: Sheri Griffith MRN: [id number] Date of Birth: 1950-08-06 Referring Provider (PT): Hazle Nordmann NP   Encounter Date: 05/16/2019  PT End of Session - 05/16/19 1244    Visit Number  2    Number of Visits  12    Date for PT Re-Evaluation  06/20/19    Authorization Time Period  p note at 10th visit    PT Start Time  1154    PT Stop Time  1230    PT Time Calculation (min)  36 min    Activity Tolerance  Patient tolerated treatment well    Behavior During Therapy  [facility] for tasks assessed/performed       Past Medical History:  Diagnosis Date  . Arthritis of knee   . Hypertension   . Sinus problem     Past Surgical History:  Procedure Laterality Date  . NONE      There were no vitals filed for this visit.  Subjective Assessment - 05/16/19 1156    Subjective  No change since evaluation, She reports that she can move her hips a little bit better    Currently in Pain?  Yes    Pain Score  5     Pain Location  --   R hip and shoulder                      [facility] Adult PT Treatment/Exercise - 05/16/19 0001      Exercises   Exercises  Lumbar      Lumbar Exercises: Stretches   Passive Hamstring Stretch  Left;4 reps;10 seconds    Single Knee to Chest Stretch  Left;4 reps;10 seconds    ITB Stretch  Left;4 reps;10 seconds    Piriformis Stretch  Left;4 reps;10 seconds      Lumbar Exercises: Standing   Row  Theraband;20 reps;Both    Theraband Level (Row)  Level 2 (Red)    Shoulder Extension  20 reps;Theraband;Both    Theraband Level (Shoulder Extension)  Level 2 (Red)      Knee/Hip Exercises: Seated   Ball Squeeze  2x10     Abduction/Adduction   Both;2 sets;10 reps    Abd/Adduction Limitations  green tband       Manual Therapy   Manual Therapy  Passive  ROM;Soft tissue mobilization    Manual therapy comments  constant cues to relac    Soft tissue mobilization  posterior cervical paraspinales  into the reaps    Passive ROM  cervical spine                  PT Long Term Goals - 05/09/19 1403      PT LONG TERM GOAL #1   Title  I with advanced HEP ( 06/20/2019)    Time  6    Period  Weeks    Status  New    Target Date  06/20/19      PT LONG TERM GOAL #2   Title  report the ability to perform her ADLs at her prior level of function with minimal to no pain ( 06/20/2019)    Time  6    Period  Weeks    Status  New    Target Date  06/20/19      PT LONG  TERM GOAL #3   Title  demo bilat hip and shoulder strength =/> 4+/5 to allow her to ambulate up/down stairs without difficulty ( 5/18/20201)    Time  6    Period  Weeks    Status  New    Target Date  06/20/19      PT LONG TERM GOAL #4   Title  improve FOTO =/< 40% limited ( 06/20/2019)    Time  6    Period  Weeks    Status  New    Target Date  06/20/19      PT LONG TERM GOAL #5   Title  report =/> 75% reduction in Rt hip pain to allow her to move it in/out of bed and car without difficulty ( 06/20/2019)    Time  6    Period  Weeks    Status  New    Target Date  06/20/19            Plan - 05/16/19 1245    Clinical Impression Statement  Pt ~ 9 minutes late for today's session. Tactile cues needed for posture with rows and extensions. Cues to maintain erect sitting posture with seated hip abd/add. Point tenderness at GT with ITB stretching. Some tightness noted in upper L traps during MT.    Personal Factors and Comorbidities  Comorbidity 2    Comorbidities  HTN, arthirtis    Examination-Activity Limitations  Bathing;Bed Mobility;Stairs    Examination-Participation Restrictions  Cleaning;Community Activity;Other    Stability/Clinical Decision Making  Stable/Uncomplicated    Rehab Potential  Good    PT Frequency  2x / week    PT Duration  6 weeks    PT  Treatment/Interventions  Iontophoresis 4mg /ml Dexamethasone;Stair training;Taping;Patient/family education;Functional mobility training;Moist Heat;Ultrasound;Cryotherapy;Electrical Stimulation;Manual techniques;Therapeutic exercise;Dry needling    PT Next Visit Plan  postural correction, back of the body stregnthening, possible ionto to Rt hip       Patient will benefit from skilled therapeutic intervention in order to improve the following deficits and impairments:  Decreased range of motion, Difficulty walking, Pain, Decreased strength, Postural dysfunction  Visit Diagnosis: Muscle weakness (generalized)  Cervicalgia  Abnormal posture  Pain in right hip     Problem List Patient Active Problem List   Diagnosis Date Noted  . Abdominal distension (gaseous) 09/04/2016  . Generalized abdominal pain 09/04/2016  . ANXIETY, CHRONIC 06/21/2007  . GERD 06/21/2007  . INTERNAL HEMORRHOIDS 02/07/2001    Scot Jun 05/16/2019, 12:47 PM  [address] [place] [place], [place], [address] Phone: [phone]   Fax:  [phone]  Name: MAGABY BEEGLE MRN: [id number] Date of Birth: [birth year]

## 2019-05-17 ENCOUNTER — Telehealth: Payer: Self-pay | Admitting: *Deleted

## 2019-05-17 NOTE — Telephone Encounter (Signed)
Faxed Cologuard requisition to [facility]. Confirmation page at 8:23 am.

## 2019-05-18 ENCOUNTER — Other Ambulatory Visit: Payer: Self-pay

## 2019-05-18 ENCOUNTER — Ambulatory Visit: Payer: Medicare Other | Admitting: Physical Therapy

## 2019-05-18 DIAGNOSIS — M25551 Pain in right hip: Secondary | ICD-10-CM

## 2019-05-18 DIAGNOSIS — M6281 Muscle weakness (generalized): Secondary | ICD-10-CM

## 2019-05-18 DIAGNOSIS — R293 Abnormal posture: Secondary | ICD-10-CM

## 2019-05-18 DIAGNOSIS — M542 Cervicalgia: Secondary | ICD-10-CM

## 2019-05-18 NOTE — Therapy (Signed)
[address] [place], [place], [address] Phone: [phone]   Fax:  [phone]  Physical Therapy Treatment  Patient Details  Name: Sheri Griffith MRN: [id number] Date of Birth: July 16, 1950 Referring Provider (PT): Hazle Nordmann NP   Encounter Date: 05/18/2019  PT End of Session - 05/18/19 1605    Visit Number  3    Number of Visits  12    Date for PT Re-Evaluation  06/20/19    PT Start Time  3662    PT Stop Time  9476    PT Time Calculation (min)  45 min       Past Medical History:  Diagnosis Date  . Arthritis of knee   . Hypertension   . Sinus problem     Past Surgical History:  Procedure Laterality Date  . NONE      There were no vitals filed for this visit.  Subjective Assessment - 05/18/19 1531    Subjective  some samll changes, pain not constant and not as sharp. all on RT side    Currently in Pain?  Yes    Pain Score  4                        [facility] Adult PT Treatment/Exercise - 05/18/19 0001      Self-Care   Self-Care  ADL's;Lifting;Posture   sitting posture     Lumbar Exercises: Aerobic   UBE (Upper Arm Bike)  L 3 2 fwd/ 2 back    Nustep  L 4 5 min      Lumbar Exercises: Machines for Strengthening   Cybex Lumbar Extension  black tband 2 sets 10    Other Lumbar Machine Exercise  row 15# 2 sets 10    Other Lumbar Machine Exercise  lat pull 15# 2 sets 10      Lumbar Exercises: Standing   Other Standing Lumbar Exercises  hip ext and abd red tband 10 x each      Lumbar Exercises: Seated   Other Seated Lumbar Exercises  pelvic ROM on sit fit   tband UE scap stab and 3# LE stab ex on sit fit     Lumbar Exercises: Supine   Ab Set  15 reps;3 seconds   with  ball   Bridge  Non-compliant;10 reps;3 seconds   feet on ball   Bridge Limitations  KTC and obl with feet on ball                  PT Long Term Goals - 05/18/19 1603      PT LONG TERM GOAL #1   Title  I with advanced HEP ( 06/20/2019)    Baseline  pt verb doing ex at home and going well    Status  Partially Met      PT LONG TERM GOAL #2   Title  report the ability to perform her ADLs at her prior level of function with minimal to no pain ( 06/20/2019)    Baseline  postural cuing needed    Status  Partially Met      PT LONG TERM GOAL #3   Title  demo bilat hip and shoulder strength =/> 4+/5 to allow her to ambulate up/down stairs without difficulty ( 5/18/20201)    Status  On-going            Plan - 05/18/19 1606    Clinical  Impression Statement  progressing with goals per pt.  pt verb doing HEP at home and going well. progressed ex but needed postural cuing- discussed need for good posture with activity at home, sitting posture an dlifting to decrease strain.    PT Treatment/Interventions  Iontophoresis 35m/ml Dexamethasone;Stair training;Taping;Patient/family education;Functional mobility training;Moist Heat;Ultrasound;Cryotherapy;Electrical Stimulation;Manual techniques;Therapeutic exercise;Dry needling    PT Next Visit Plan  postural correction, back of the body stregnthening,       Patient will benefit from skilled therapeutic intervention in order to improve the following deficits and impairments:  Decreased range of motion, Difficulty walking, Pain, Decreased strength, Postural dysfunction  Visit Diagnosis: Muscle weakness (generalized)  Cervicalgia  Abnormal posture  Pain in right hip     Problem List Patient Active Problem List   Diagnosis Date Noted  . Screening for osteoporosis 05/16/2019  . Screening for colon cancer 05/16/2019  . Encounter for screening mammogram for malignant neoplasm of breast 05/16/2019  . Need for prophylactic vaccination against Streptococcus pneumoniae (pneumococcus) 05/16/2019  . Uncontrolled hypertension 05/16/2019  . Bursitis of hip 05/16/2019  . Abdominal distension (gaseous) 09/04/2016  . Generalized abdominal pain  09/04/2016  . ANXIETY, CHRONIC 06/21/2007  . GERD 06/21/2007  . INTERNAL HEMORRHOIDS 02/07/2001    Sheila Ocasio,ANGIE PTA 05/18/2019, 4:12 PM  [address] [address]Phone: [phone]  Fax:  [phone] Name: Sheri KARNESMRN: [id number] of Birth: 317-Dec-1952

## 2019-05-22 ENCOUNTER — Encounter: Payer: Self-pay | Admitting: Emergency Medicine

## 2019-05-22 ENCOUNTER — Other Ambulatory Visit: Payer: Self-pay

## 2019-05-22 ENCOUNTER — Ambulatory Visit
Admission: EM | Admit: 2019-05-22 | Discharge: 2019-05-22 | Disposition: A | Discharge status: Home / Self Care | Payer: Medicare Other | Attending: Emergency Medicine | Admitting: Emergency Medicine

## 2019-05-22 DIAGNOSIS — M25512 Pain in left shoulder: Secondary | ICD-10-CM | POA: Diagnosis not present

## 2019-05-22 MED ORDER — NAPROXEN 500 MG PO TABS: 500.0000 mg | ORAL_TABLET | Freq: Two times a day (BID) | ORAL | 0 refills | Status: DC

## 2019-05-22 MED ORDER — TIZANIDINE HCL 4 MG PO TABS: 4.0000 mg | ORAL_TABLET | Freq: Four times a day (QID) | ORAL | 0 refills | Status: AC | PRN
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
## 2019-05-22 NOTE — Discharge Instructions (Addendum)
Recommend RICE: rest, ice, compression, elevation as needed for pain.    Heat therapy (hot compress, warm wash rag, hot showers, etc.) can help relax muscles and soothe muscle aches. Cold therapy (ice packs) can be used to help swelling both after injury and after prolonged use of areas of chronic pain/aches.  For pain: Take naproxen as directed. May take Tylenol as needed additionally. Do not take ibuprofen with this medication.  May take muscle relaxer as needed for severe pain / spasm.  (This medication may cause you to become tired so it is important you do not drink alcohol or operate heavy machinery while on this medication.  Recommend your first dose to be taken before bedtime to monitor for side effects safely)  Return for worsening pain, bruising, chest pain, difficulty breathing.

## 2019-05-23 ENCOUNTER — Encounter: Payer: Self-pay | Admitting: Emergency Medicine

## 2019-05-23 ENCOUNTER — Ambulatory Visit: Payer: Medicare Other | Admitting: Physical Therapy

## 2019-05-25 ENCOUNTER — Ambulatory Visit: Payer: Medicare Other | Admitting: Physical Therapy

## 2019-05-29 ENCOUNTER — Ambulatory Visit: Payer: Medicare Other | Admitting: Physical Therapy

## 2019-05-31 ENCOUNTER — Encounter: Payer: Medicare Other | Admitting: Physical Therapy

## 2019-06-15 DIAGNOSIS — M5386 Other specified dorsopathies, lumbar region: Secondary | ICD-10-CM | POA: Insufficient documentation

## 2019-06-15 DIAGNOSIS — M542 Cervicalgia: Secondary | ICD-10-CM | POA: Insufficient documentation

## 2019-06-22 ENCOUNTER — Encounter: Payer: Self-pay | Admitting: Adult Health Nurse Practitioner

## 2020-02-02 ENCOUNTER — Ambulatory Visit
Admission: EM | Admit: 2020-02-02 | Discharge: 2020-02-02 | Discharge status: Against Medical Advice | Payer: Medicare Other

## 2020-02-02 ENCOUNTER — Other Ambulatory Visit: Payer: Self-pay

## 2020-02-02 NOTE — ED Triage Notes (Addendum)
Pt states Thursday after walking for an hour her clothes became wet. States last night had the chills, slight cough, and nasal congestion. Pt states she wants to make sure she didn't get PNA from the wet clothes. No cough at this time, no distress or SOB noted.

## 2020-02-03 ENCOUNTER — Other Ambulatory Visit: Payer: Self-pay

## 2020-02-03 ENCOUNTER — Encounter (HOSPITAL_COMMUNITY): Payer: Self-pay | Admitting: Emergency Medicine

## 2020-02-03 ENCOUNTER — Ambulatory Visit (HOSPITAL_COMMUNITY)
Admission: EM | Admit: 2020-02-03 | Discharge: 2020-02-03 | Disposition: A | Discharge status: Home / Self Care | Payer: Medicare Other | Attending: Family Medicine | Admitting: Family Medicine

## 2020-02-03 DIAGNOSIS — R6883 Chills (without fever): Secondary | ICD-10-CM | POA: Diagnosis not present

## 2020-02-03 DIAGNOSIS — I10 Essential (primary) hypertension: Secondary | ICD-10-CM

## 2020-02-03 NOTE — ED Triage Notes (Signed)
PT C/O: chills onset 3 days after she went for a walk and started to sweat.   DENIES: f/v/n/d, cough, runny nose  TAKING MEDS: OTC acetaminophen   A&O x4... NAD... Ambulatory

## 2020-02-03 NOTE — ED Provider Notes (Addendum)
[place]   [id number] 02/03/20 Arrival Time: 1016  ASSESSMENT & PLAN:  1. Chills   2. Elevated blood pressure reading in office with diagnosis of hypertension    Declines COVID testing. Likely viral illness. Discussed. OTC symptom care as needed.   Follow-up Information    Schedule an appointment as soon as possible for a visit  with Wendall Mola, NP.   Specialty: Adult Health Nurse Practitioner Why: To recheck your blood pressure. Contact information: [place] [address] [phone]                Discharge Instructions     Your blood pressure was noted to be elevated during your visit today. If you are currently taking medication for high blood pressure, please ensure you are taking this as directed. If you do not have a history of high blood pressure and your blood pressure remains persistently elevated, you may need to begin taking a medication at some point. You may return here within the next few days to recheck if unable to see your primary care provider or if do not have a one.  BP (!) 180/83 (BP Location: Right Arm)   Pulse 64   Temp 98.6 F (37 C) (Oral)   Resp 18   SpO2 100%       Reviewed expectations re: course of current medical issues. Questions answered. Outlined signs and symptoms indicating need for more acute intervention. Understanding verbalized. After Visit Summary given.   SUBJECTIVE: History from: patient. Sheri Griffith is a 70 y.o. female who presents with worries regarding COVID-19. Known COVID-19 contact: none. Recent travel: none. Reports: chills and mild fatigue. Denies: runny nose, congestion, fever, cough, sore throat, difficulty breathing and headache. Normal PO intake without n/v/d.  Increased blood pressure noted today. Reports that she is not treated for HTN. She reports no chest pain on exertion, no dyspnea on exertion, no swelling of ankles, no orthostatic dizziness or lightheadedness, no  orthopnea or paroxysmal nocturnal dyspnea and no palpitations.   OBJECTIVE:  Vitals:   02/03/20 1111  BP: (!) 180/83  Pulse: 64  Resp: 18  Temp: 98.6 F (37 C)  TempSrc: Oral  SpO2: 100%    General appearance: alert; no distress Eyes: PERRLA; EOMI; conjunctiva normal HENT: [place]; AT; without nasal congestion Neck: supple  Lungs: speaks full sentences without difficulty; unlabored Extremities: no edema Skin: warm and dry Neurologic: normal gait Psychological: alert and cooperative; normal mood and affect    Allergies  Allergen Reactions  . Procaine Hcl   . Cortisone Hives  . Lidocaine Rash    Past Medical History:  Diagnosis Date  . Arthritis of knee   . Hypertension   . Sinus problem    Social History   Socioeconomic History  . Marital status: Married    Spouse name: Not on file  . Number of children: 2  . Years of education: Not on file  . Highest education level: Not on file  Occupational History  . Occupation: Oceanographer  Tobacco Use  . Smoking status: Never Smoker  . Smokeless tobacco: Never Used  Substance and Sexual Activity  . Alcohol use: Yes    Comment: occasional use  . Drug use: No  . Sexual activity: Yes    Birth control/protection: None  Other Topics Concern  . Not on file  Social History Narrative  . Not on file   Social Determinants of Health   Financial Resource Strain: Not on  file  Food Insecurity: Not on file  Transportation Needs: Not on file  Physical Activity: Not on file  Stress: Not on file  Social Connections: Not on file  Intimate Partner Violence: Not on file   Family History  Problem Relation Age of Onset  . Other Mother        staph infection  . Other Father        died age 67  . Colon cancer Neg Hx   . Rectal cancer Neg Hx   . Esophageal cancer Neg Hx   . Liver cancer Neg Hx    Past Surgical History:  Procedure Laterality Date  . Simonne Martinet, MD 02/03/20 1241    Mardella Layman,  MD 02/03/20 1241

## 2020-02-03 NOTE — Discharge Instructions (Addendum)
Your blood pressure was noted to be elevated during your visit today. If you are currently taking medication for high blood pressure, please ensure you are taking this as directed. If you do not have a history of high blood pressure and your blood pressure remains persistently elevated, you may need to begin taking a medication at some point. You may return here within the next few days to recheck if unable to see your primary care provider or if do not have a one.  BP (!) 180/83 (BP Location: Right Arm)    Pulse 64    Temp 98.6 F (37 C) (Oral)    Resp 18    SpO2 100%

## 2020-04-08 ENCOUNTER — Other Ambulatory Visit: Payer: Self-pay

## 2020-04-08 ENCOUNTER — Ambulatory Visit (INDEPENDENT_AMBULATORY_CARE_PROVIDER_SITE_OTHER): Payer: Medicare Other | Admitting: Registered Nurse

## 2020-04-08 ENCOUNTER — Other Ambulatory Visit: Payer: Self-pay | Admitting: Registered Nurse

## 2020-04-08 ENCOUNTER — Encounter: Payer: Self-pay | Admitting: Registered Nurse

## 2020-04-08 VITALS — BP 158/67 | HR 77 | Temp 98.0°F | Resp 18 | Ht 64.0 in

## 2020-04-08 DIAGNOSIS — J301 Allergic rhinitis due to pollen: Secondary | ICD-10-CM | POA: Diagnosis not present

## 2020-04-08 DIAGNOSIS — M5441 Lumbago with sciatica, right side: Secondary | ICD-10-CM | POA: Diagnosis not present

## 2020-04-08 DIAGNOSIS — M8580 Other specified disorders of bone density and structure, unspecified site: Secondary | ICD-10-CM

## 2020-04-08 DIAGNOSIS — G8929 Other chronic pain: Secondary | ICD-10-CM

## 2020-04-08 DIAGNOSIS — M859 Disorder of bone density and structure, unspecified: Secondary | ICD-10-CM

## 2020-04-08 MED ORDER — CYCLOBENZAPRINE HCL 5 MG PO TABS: 5.0000 mg | ORAL_TABLET | Freq: Three times a day (TID) | ORAL | 1 refills | Status: DC | PRN

## 2020-04-08 MED ORDER — GABAPENTIN 100 MG PO CAPS: 100.0000 mg | ORAL_CAPSULE | Freq: Three times a day (TID) | ORAL | 3 refills | Status: DC

## 2020-04-08 MED ORDER — AZELASTINE HCL 0.1 % NA SOLN: 1.0000 | Freq: Two times a day (BID) | NASAL | 12 refills | Status: DC

## 2020-04-08 NOTE — Progress Notes (Signed)
Acute Office Visit  Subjective:    Patient ID: Sheri Griffith, female    DOB: 03-14-1950, 70 y.o.   MRN: [id number]  Chief Complaint  Patient presents with  . Leg Injury    Patient states she has been experiencing problems with her right leg thinks she pulled her hamstring while stretching.she has been taking tylenol and muscle relaxer and only for a short period.     HPI Patient is in today for R leg pain  Onset about 1 week ago Thinks she may have pulled a muscle Shooting pain down R leg, worst in posterior thigh but does extend to lower buttock/lower back and down into calf and foot Positive straight leg raise at home Walks 3-4 times weekly, stretches daily No swelling, pain, redness, heat. States she had DEXA scan in dec - not able to locate results in chart.  CT abd/pelvis in 2018 did not some degenerative changes in spine  Also notes recent sinus pressure. Happens with change in season. Has taken OTCs in the past.   Past Medical History:  Diagnosis Date  . Arthritis of knee   . Hypertension   . Sinus problem     Past Surgical History:  Procedure Laterality Date  . NONE      Family History  Problem Relation Age of Onset  . Other Mother        staph infection  . Other Father        died age 69  . Colon cancer Neg Hx   . Rectal cancer Neg Hx   . Esophageal cancer Neg Hx   . Liver cancer Neg Hx     Social History   Socioeconomic History  . Marital status: Married    Spouse name: Not on file  . Number of children: 2  . Years of education: Not on file  . Highest education level: Not on file  Occupational History  . Occupation: Oceanographer  Tobacco Use  . Smoking status: Never Smoker  . Smokeless tobacco: Never Used  Substance and Sexual Activity  . Alcohol use: Yes    Comment: occasional use  . Drug use: No  . Sexual activity: Yes    Birth control/protection: None  Other Topics Concern  . Not on file  Social History Narrative  . Not on  file   Social Determinants of Health   Financial Resource Strain: Not on file  Food Insecurity: Not on file  Transportation Needs: Not on file  Physical Activity: Not on file  Stress: Not on file  Social Connections: Not on file  Intimate Partner Violence: Not on file    No outpatient medications prior to visit.   No facility-administered medications prior to visit.    Allergies  Allergen Reactions  . Procaine Hcl   . Cortisone Hives  . Lidocaine Rash    Review of Systems Per hpi      Objective:    Physical Exam Vitals and nursing note reviewed.  Constitutional:      General: She is not in acute distress.    Appearance: Normal appearance. She is not ill-appearing, toxic-appearing or diaphoretic.  Cardiovascular:     Rate and Rhythm: Normal rate and regular rhythm.     Pulses: Normal pulses.     Heart sounds: Normal heart sounds. No murmur heard. No friction rub. No gallop.   Pulmonary:     Effort: Pulmonary effort is normal. No respiratory distress.     Breath sounds: Normal  breath sounds. No stridor. No wheezing, rhonchi or rales.  Chest:     Chest wall: No tenderness.  Musculoskeletal:        General: No swelling, tenderness, deformity or signs of injury. Normal range of motion.     Right lower leg: No edema.     Left lower leg: No edema.     Comments: r side positive straight leg raise  Skin:    General: Skin is warm and dry.     Capillary Refill: Capillary refill takes less than 2 seconds.  Neurological:     General: No focal deficit present.     Mental Status: She is alert and oriented to person, place, and time. Mental status is at baseline.  Psychiatric:        Mood and Affect: Mood normal.        Behavior: Behavior normal.        Thought Content: Thought content normal.        Judgment: Judgment normal.     BP (!) 158/67   Pulse 77   Temp 98 F (36.7 C) (Temporal)   Resp 18   Ht 5\' 4"  (1.626 m)   SpO2 98%   BMI 33.81 kg/m  Wt Readings  from Last 3 Encounters:  05/16/19 197 lb (89.4 kg)  01/30/17 191 lb (86.6 kg)  09/04/16 195 lb (88.5 kg)    There are no preventive care reminders to display for this patient.  There are no preventive care reminders to display for this patient.   Lab Results  Component Value Date   TSH 2.12 05/31/2007   Lab Results  Component Value Date   WBC 3.5 (L) 09/04/2016   HGB 14.8 09/04/2016   HCT 44.5 09/04/2016   MCV 89.4 09/04/2016   PLT 194.0 09/04/2016   Lab Results  Component Value Date   NA 141 09/04/2016   K 4.2 09/04/2016   CO2 29 09/04/2016   GLUCOSE 105 (H) 09/04/2016   BUN 12 09/04/2016   CREATININE 0.90 09/04/2016   BILITOT 1.2 05/31/2007   ALKPHOS 62 05/31/2007   AST 21 05/31/2007   ALT 22 05/31/2007   PROT 7.0 05/31/2007   ALBUMIN 3.9 05/31/2007   CALCIUM 9.4 09/04/2016   GFR 80.71 09/04/2016   No results found for: CHOL No results found for: HDL No results found for: LDLCALC No results found for: TRIG No results found for: CHOLHDL No results found for: HGBA1C     Assessment & Plan:   Problem List Items Addressed This Visit   None   Visit Diagnoses    Chronic bilateral low back pain with right-sided sciatica    -  Primary   Relevant Medications   gabapentin (NEURONTIN) 100 MG capsule   cyclobenzaprine (FLEXERIL) 5 MG tablet   Other Relevant Orders   DG Lumbar Spine Complete   DG Hip Unilat W OR W/O Pelvis 2-3 Views Left   DG Hip Unilat W OR W/O Pelvis 2-3 Views Right   Seasonal allergic rhinitis due to pollen       Relevant Medications   azelastine (ASTELIN) 0.1 % nasal spray       Meds ordered this encounter  Medications  . gabapentin (NEURONTIN) 100 MG capsule    Sig: Take 1 capsule (100 mg total) by mouth 3 (three) times daily.    Dispense:  90 capsule    Refill:  3    Order Specific Question:   Supervising Provider    Answer:  GREENE, JEFFREY R [[id number]]  . azelastine (ASTELIN) 0.1 % nasal spray    Sig: Place 1 spray into both  nostrils 2 (two) times daily. Use in each nostril as directed    Dispense:  30 mL    Refill:  12    Order Specific Question:   Supervising Provider    Answer:   Carlota Raspberry, JEFFREY R [[id number]]  . cyclobenzaprine (FLEXERIL) 5 MG tablet    Sig: Take 1 tablet (5 mg total) by mouth 3 (three) times daily as needed for muscle spasms.    Dispense:  30 tablet    Refill:  1    Order Specific Question:   Supervising Provider    Answer:   Carlota Raspberry, JEFFREY R [[id number]]   PLAN  Refill flexeril  Discussed rest, continued stretching, and nonpharm  Will give gabapentin for sciatic pain  Azelastine for seasonal allergies  Xray of lumbar spine and hips. Rule out OA and degenerative changes. Low suspicion for fx  Patient encouraged to call clinic with any questions, comments, or concerns.   Maximiano Coss, NP

## 2020-04-08 NOTE — Patient Instructions (Signed)
° ° ° °  If you have lab work done today you will be contacted with your lab results within the next 2 weeks.  If you have not heard from us then please contact us. The fastest way to get your results is to register for My Chart. ° ° °IF you received an x-ray today, you will receive an invoice from [facility]. Please contact [facility] at [phone] with questions or concerns regarding your invoice.  ° °IF you received labwork today, you will receive an invoice from [facility]. Please contact [facility] at [phone] with questions or concerns regarding your invoice.  ° °Our billing staff will not be able to assist you with questions regarding bills from these companies. ° °You will be contacted with the lab results as soon as they are available. The fastest way to get your results is to activate your My Chart account. Instructions are located on the last page of this paperwork. If you have not heard from us regarding the results in 2 weeks, please contact this office. °  ° ° ° °

## 2020-04-09 ENCOUNTER — Telehealth: Payer: Self-pay | Admitting: Registered Nurse

## 2020-04-09 NOTE — Telephone Encounter (Signed)
Patients wants a call back regarding x-ray  And has a question about t medications warnings / patient has some concerns  with side effects listed   For  gabapentin (NEURONTIN) 100 MG capsule    Wants to to talk to someone clinical before taking medicine

## 2020-04-10 ENCOUNTER — Ambulatory Visit
Admission: RE | Admit: 2020-04-10 | Discharge: 2020-04-10 | Disposition: A | Discharge status: Home / Self Care | Payer: Medicare Other | Source: Ambulatory Visit | Attending: Registered Nurse | Admitting: Registered Nurse

## 2020-04-10 ENCOUNTER — Telehealth: Payer: Self-pay

## 2020-04-10 ENCOUNTER — Other Ambulatory Visit: Payer: Self-pay | Admitting: Registered Nurse

## 2020-04-10 DIAGNOSIS — G8929 Other chronic pain: Secondary | ICD-10-CM

## 2020-04-10 DIAGNOSIS — M5441 Lumbago with sciatica, right side: Secondary | ICD-10-CM

## 2020-04-10 NOTE — Telephone Encounter (Signed)
Error please ignore

## 2020-04-11 ENCOUNTER — Telehealth: Payer: Self-pay | Admitting: Registered Nurse

## 2020-04-11 NOTE — Telephone Encounter (Signed)
Pt was seen on 04/08/20 by Sheri Griffith for a personal R hamstring injury. She is wanting a referral for this issue, so she can see a specialist. PCP sent her to one last year on [address]. She would like the same again. Please advise at [phone] she is having a lot of movement issues from the pain.

## 2020-04-12 ENCOUNTER — Other Ambulatory Visit: Payer: Self-pay | Admitting: Registered Nurse

## 2020-04-12 DIAGNOSIS — G8929 Other chronic pain: Secondary | ICD-10-CM

## 2020-04-12 NOTE — Telephone Encounter (Signed)
Pt requesting referral back to PT at [facility] ([address], [place], [place] [id number]) due to mobility issues from Hamstring injury.  Please advise

## 2020-04-12 NOTE — Progress Notes (Signed)
Sent,  Thanks,  Writer

## 2020-04-12 NOTE — Telephone Encounter (Signed)
Called pt back had concerns due to long list of side effects pt states she will think about the gabapentin but wants to try PT first.

## 2020-04-12 NOTE — Progress Notes (Signed)
If we could contact patient -  Degenerative changes in the spine. If her pain is improving, great, if not, we can consider referral.  Thank you  Rich

## 2020-04-18 ENCOUNTER — Other Ambulatory Visit: Payer: Self-pay

## 2020-04-18 ENCOUNTER — Ambulatory Visit: Payer: Medicare Other | Attending: Registered Nurse

## 2020-04-18 DIAGNOSIS — R293 Abnormal posture: Secondary | ICD-10-CM | POA: Insufficient documentation

## 2020-04-18 DIAGNOSIS — M6281 Muscle weakness (generalized): Secondary | ICD-10-CM | POA: Insufficient documentation

## 2020-04-18 DIAGNOSIS — R2689 Other abnormalities of gait and mobility: Secondary | ICD-10-CM | POA: Diagnosis present

## 2020-04-18 DIAGNOSIS — M79604 Pain in right leg: Secondary | ICD-10-CM | POA: Insufficient documentation

## 2020-04-18 DIAGNOSIS — M5441 Lumbago with sciatica, right side: Secondary | ICD-10-CM | POA: Insufficient documentation

## 2020-04-18 DIAGNOSIS — G8929 Other chronic pain: Secondary | ICD-10-CM | POA: Insufficient documentation

## 2020-04-18 NOTE — Patient Instructions (Signed)
Access Code: [email or web address] URL: [email or web address] Date: 04/18/2020 Prepared by: Sherlynn Stalls  Exercises Supine Hip Internal and External Rotation - 1 x daily - 7 x weekly - 1 sets - 10 reps Seated Heel Toe Raises - 1 x daily - 7 x weekly - 1 sets - 10 reps Supine Single Knee to Chest - 1 x daily - 7 x weekly - 4 sets - 20 seconds hold Heat - 1 x daily - 7 x weekly - 10 -20 minutes hold

## 2020-04-18 NOTE — Therapy (Signed)
[place]. [place], [place], [address] Phone: [phone]   Fax:  [phone]  Physical Therapy Evaluation  Patient Details  Name: Sheri Griffith MRN: [id number] Date of Birth: 09-20-1950 Referring Provider (PT): Maximiano Coss, NP   Encounter Date: 04/18/2020   PT End of Session - 04/18/20 1801    Visit Number 1    Number of Visits 11    Date for PT Re-Evaluation 06/13/20    Authorization Type [employer] MRC    PT Start Time 9924    PT Stop Time 1700    PT Time Calculation (min) 45 min    Activity Tolerance Patient tolerated treatment well;Patient limited by pain    Behavior During Therapy [facility] for tasks assessed/performed           Past Medical History:  Diagnosis Date  . Arthritis of knee   . Hypertension   . Sinus problem     Past Surgical History:  Procedure Laterality Date  . NONE      There were no vitals filed for this visit.    Subjective Assessment - 04/18/20 1619    Subjective Pt  presents with reports of low back and right leg pain into the right foot pain that worsened over the past 2 weeks.  Says it worsened the day after doing some stretches outside. She had PT early 2021 for  this LBP and neck pain which had gotten better for a while. Has had trouble walking so started using a crutch on 1 side.    Limitations Walking;Standing;Lifting;Sitting    How long can you sit comfortably? 15-20 minutes before needing to change positions    How long can you walk comfortably? unable to do without pain any distance    Patient Stated Goals decrease pain, go back to walking routine 1 hr 3-4x/wk    Currently in Pain? Yes    Pain Score 10-Worst pain ever    Pain Location --   RLE - back of distal thigh and calf into foot   Pain Orientation Right    Pain Descriptors / Indicators Cramping;Sharp;Shooting;Dull    Pain Type Chronic pain              [facility] PT Assessment - 04/18/20 0001      Assessment    Medical Diagnosis M54.41,G89.29 (ICD-10-CM) - Chronic bilateral low back pain with right-sided sciatica    Referring Provider (PT) Maximiano Coss, NP    Hand Dominance Right    Next MD Visit not yet scheduled    Prior Therapy yes 05/2019      Balance Screen   Has the patient fallen in the past 6 months No    Has the patient had a decrease in activity level because of a fear of falling?  No    Is the patient reluctant to leave their home because of a fear of falling?  Yes      [place] Private residence    Living Arrangements Spouse/significant other    Home Layout Two level    Alternate Level Stairs-Number of Steps 9-10 steps with R HR when ascending, difficult and leans to put hands on steps in front of her to climb up (most comfortable)      Prior Function   Level of Independence Independent   slow   Vocation Unemployed   Substitute teacher   Leisure walking , reading, hike easy  Cognition   Overall Cognitive Status Within Functional Limits for tasks assessed      Observation/Other Assessments   Focus on Therapeutic Outcomes (FOTO)  To be assessed      Posture/Postural Control   Postural Limitations Forward head;Rounded Shoulders;Increased thoracic kyphosis      AROM   Lumbar Flexion almost to floor, slight Rt LE pain     Lumbar Extension 25% + RLE pain exacerbation    Lumbar - Rotation 50% to the right   75% to the left     Strength   Right/Left Hip --   Flex 3+/5 R, 4/5 L.   Right/Left Knee --   L knee flex/ext 4+/5. R knee 3/5 + pain   Right/Left Ankle --   R ankle DF 4/5, L ankle DF 5/5.     Flexibility   Soft Tissue Assessment /Muscle Length yes    Hamstrings mild tightness B    ITB tight Rt       Special Tests    Special Tests Lumbar    Lumbar Tests Slump Test      Slump test   Findings Positive    Side Right      Standardized Balance Assessment   Standardized Balance Assessment Timed Up and Go Test      Timed Up and Go  Test   Normal TUG (seconds) 30            Transfers: dependent on BUE to assist into standing  Gait: antalgic with crutch on the left, decreased WB/WS on the R, step to pattern Stairs: step to lead left to ascend, lead right to descend with HR and pain Palpation: significant hypersensitivity and tenderness to light touch along the posterior and lateral thigh, calf.             Objective measurements completed on examination: See above findings.               PT Education - 04/18/20 1820    Education Details POC and HEP. Access Code: [email or web address]  Exercises  Supine Hip Internal and External Rotation - 1 x daily - 7 x weekly - 1 sets - 10 reps  Seated Heel Toe Raises - 1 x daily - 7 x weekly - 1 sets - 10 reps  Supine Single Knee to Chest - 1 x daily - 7 x weekly - 4 sets - 20 seconds hold  Heat - 1 x daily - 7 x weekly - 10 -20 minutes hold    Person(s) Educated Patient    Methods Explanation;Demonstration    Comprehension Verbalized understanding;Returned demonstration;Need further instruction            PT Short Term Goals - 04/18/20 1812      PT SHORT TERM GOAL #1   Title Independent with initial HEP    Time 2    Period Weeks    Status New    Target Date 05/02/20             PT Long Term Goals - 04/18/20 1812      PT LONG TERM GOAL #1   Title I with advanced HEP    Time 8    Period Weeks    Status New    Target Date 06/13/20      PT LONG TERM GOAL #2   Title report the ability to perform her ADLs at her prior level of function with minimal to no pain    Time 8  Period Weeks    Status New    Target Date 06/13/20      PT LONG TERM GOAL #3   Title demo BLE strength =/> 4+/5 to allow her to ambulate up/down stairs without difficulty    Time 8    Period Weeks    Status New    Target Date 06/13/20      PT LONG TERM GOAL #4   Title Pt will report </= 2/10 pain with walking for at least 1 hr  to facilitate return to independent walking  program.    Time 8    Period Weeks    Status New    Target Date 06/13/20      PT LONG TERM GOAL #5   Title Pt will score <12 seconds on the TUG with AD  to demo improved functional gait speed and decreased falls risk    Time 8    Period Weeks    Status New    Target Date 06/13/20                  Plan - 04/18/20 1803    Clinical Impression Statement Pt is a 70 yo female who presents with low back pain with radiating pain into the RLE down to the right foot. She was treated around this time last year at this clinic for similar pain which had improved, but was recently exacerbated 2 weeks ago when stretching after going for a walk.  She has some stiffness in her back, very irritable hypersensitivity along the posterior RLE,  muscle weakness, decreased gait speed, and abnormal gait using a single crutch for support due to pain. She reports having to  perform ADLs much slower d/t pain, and has difficulty with stairs at home.  She would benefit from PT to address her deficts, reduce pain and restore normal activities.    Personal Factors and Comorbidities Comorbidity 2    Comorbidities HTN, arthirtis    Examination-Activity Limitations Bathing;Bed Mobility;Stairs    Examination-Participation Restrictions Cleaning;Community Activity    Stability/Clinical Decision Making Evolving/Moderate complexity    Clinical Decision Making Moderate    Rehab Potential Good    PT Frequency Other (comment)   2x/wk for 2 wks folowed by decrease to 1x/wk for 4-6 wks.   PT Duration 8 weeks    PT Treatment/Interventions Iontophoresis 4mg /ml Dexamethasone;Stair training;Taping;Patient/family education;Functional mobility training;Moist Heat;Ultrasound;Cryotherapy;Electrical Stimulation;Manual techniques;Therapeutic exercise;Dry needling;ADLs/Self Care Home Management;Therapeutic activities;Neuromuscular re-education    PT Next Visit Plan complete FOTO for back. Gentle initiation of lumbar and LE flexibility,  core and LE stregthening as tolerated. Manual and modalities as needed. Extension seems to exacerbate pain. Plan to incorporate gait and stair training with progression toward independence based on tolrance.    PT Home Exercise Plan see pt edu    Consulted and Agree with Plan of Care Patient           Patient will benefit from skilled therapeutic intervention in order to improve the following deficits and impairments:  Decreased range of motion,Difficulty walking,Pain,Decreased strength,Postural dysfunction,Abnormal gait,Increased muscle spasms,Impaired flexibility,Decreased mobility,Decreased balance  Visit Diagnosis: Muscle weakness (generalized) - Plan: PT plan of care cert/re-cert  Abnormal posture - Plan: PT plan of care cert/re-cert  Chronic right-sided low back pain with right-sided sciatica - Plan: PT plan of care cert/re-cert  Pain in right leg - Plan: PT plan of care cert/re-cert  Other abnormalities of gait and mobility - Plan: PT plan of care cert/re-cert     Problem List  Patient Active Problem List   Diagnosis Date Noted  . Pain of cervical facet joint 06/15/2019  . Sciatica of left side associated with disorder of lumbar spine 06/15/2019  . Screening for osteoporosis 05/16/2019  . Screening for colon cancer 05/16/2019  . Encounter for screening mammogram for malignant neoplasm of breast 05/16/2019  . Need for prophylactic vaccination against Streptococcus pneumoniae (pneumococcus) 05/16/2019  . Uncontrolled hypertension 05/16/2019  . Bursitis of hip 05/16/2019  . Abdominal distension (gaseous) 09/04/2016  . Generalized abdominal pain 09/04/2016  . ANXIETY, CHRONIC 06/21/2007  . GERD 06/21/2007  . INTERNAL HEMORRHOIDS 02/07/2001    Hall Busing, PT, DPT 04/18/2020, 6:21 PM  [place]. [place], [place], [address] Phone: [phone]   Fax:  [phone]  Name: INDYAH SAULNIER MRN:  [id number] Date of Birth: 1950/09/04

## 2020-04-22 ENCOUNTER — Telehealth: Payer: Self-pay | Admitting: Adult Health Nurse Practitioner

## 2020-04-22 NOTE — Telephone Encounter (Signed)
Pt came in recently and was prescribed a nose spray Rx for her stuffy nose/ sinus infection/ but pt states the spray was over $100 and she wanted to know if something else can be called in for a lower cost / she didn't pick that Rx /please advise asap

## 2020-04-22 NOTE — Telephone Encounter (Signed)
Rx nasal solution on 04/08/2020 pt didn't pick up $100+ out of pocket. Pt requesting alternative.

## 2020-04-23 ENCOUNTER — Ambulatory Visit: Payer: Medicare Other | Admitting: Physical Therapy

## 2020-04-23 ENCOUNTER — Other Ambulatory Visit: Payer: Self-pay

## 2020-04-23 ENCOUNTER — Encounter: Payer: Self-pay | Admitting: Physical Therapy

## 2020-04-23 DIAGNOSIS — M79604 Pain in right leg: Secondary | ICD-10-CM

## 2020-04-23 DIAGNOSIS — M6281 Muscle weakness (generalized): Secondary | ICD-10-CM

## 2020-04-23 DIAGNOSIS — R293 Abnormal posture: Secondary | ICD-10-CM

## 2020-04-23 NOTE — Therapy (Signed)
Sheri Griffith, [place], [address] Phone: [phone]   Fax:  [phone]  Physical Therapy Treatment  Patient Details  Name: Sheri Griffith MRN: [id number] Date of Birth: [birth year] Referring Provider (PT): Sheri Coss, NP   Encounter Date: 04/23/2020   PT End of Session - 04/23/20 0951    Visit Number 2    Number of Visits 11    Date for PT Re-Evaluation 06/13/20    Authorization Type [employer] MRC    PT Start Time 0920    PT Stop Time 1010    PT Time Calculation (min) 50 min    Activity Tolerance Patient limited by pain    Behavior During Therapy Agitated           Past Medical History:  Diagnosis Date  . Arthritis of knee   . Hypertension   . Sinus problem     Past Surgical History:  Procedure Laterality Date  . NONE      There were no vitals filed for this visit.   Subjective Assessment - 04/23/20 0932    Subjective Patient calls and reports that she needs w/c to come in the building, rates pain a 10/10 in the right buttock and sciatic area    Currently in Pain? Yes    Pain Score 10-Worst pain ever    Pain Location Buttocks    Pain Orientation Right    Aggravating Factors  any activity    Pain Relieving Factors gentle movements                             [facility] Adult PT Treatment/Exercise - 04/23/20 0001      Lumbar Exercises: Stretches   Passive Hamstring Stretch 4 reps;10 seconds    Single Knee to Chest Stretch Right;4 reps;10 seconds    Piriformis Stretch Right;Left;4 reps;10 seconds      Lumbar Exercises: Supine   Pelvic Tilt 5 reps;10 reps    Other Supine Lumbar Exercises SAQ no weight on the right just to get her moving    Other Supine Lumbar Exercises feet on ball K2C, trunk rotation, isometric abs, posterior chain activation, (the start of a bridge)      Modalities   Modalities Moist Heat;Electrical Stimulation      Moist Heat Therapy   Number Minutes  Moist Heat 10 Minutes    Moist Heat Location Lumbar Spine      Electrical Stimulation   Electrical Stimulation Location right buttock    Electrical Stimulation Action IFC    Electrical Stimulation Parameters seated    Electrical Stimulation Goals Pain                    PT Short Term Goals - 04/23/20 0959      PT SHORT TERM GOAL #1   Title Independent with initial HEP    Status Partially Met             PT Long Term Goals - 04/18/20 1812      PT LONG TERM GOAL #1   Title I with advanced HEP    Time 8    Period Weeks    Status New    Target Date 06/13/20      PT LONG TERM GOAL #2   Title report the ability to perform her ADLs at her prior level of function with minimal to no pain  Time 8    Period Weeks    Status New    Target Date 06/13/20      PT LONG TERM GOAL #3   Title demo BLE strength =/> 4+/5 to allow her to ambulate up/down stairs without difficulty    Time 8    Period Weeks    Status New    Target Date 06/13/20      PT LONG TERM GOAL #4   Title Pt will report </= 2/10 pain with walking for at least 1 hr  to facilitate return to independent walking program.    Time 8    Period Weeks    Status New    Target Date 06/13/20      PT LONG TERM GOAL #5   Title Pt will score <12 seconds on the TUG with AD  to demo improved functional gait speed and decreased falls risk    Time 8    Period Weeks    Status New    Target Date 06/13/20                 Plan - 04/23/20 5701    Clinical Impression Statement Patient called to be brought in by a w/c due to high pain.  She reports that she was a little more actrive yesterday and was in much more pain last night, did not sleep, reports pain a 10/10, she really struggled to find a posistion that was comfortable, she was very tender to even light touch to the right HS, and buttock.  She tended to lean to the left getting weight off the right side.  I really had difficulty getting her to get in a  position to try exercises, some movements she reported feeling good but could not tolerate much    PT Next Visit Plan really try to get her moving, she just was so uncomfortable today that she could not do much    Consulted and Agree with Plan of Care Patient           Patient will benefit from skilled therapeutic intervention in order to improve the following deficits and impairments:  Decreased range of motion,Difficulty walking,Pain,Decreased strength,Postural dysfunction,Abnormal gait,Increased muscle spasms,Impaired flexibility,Decreased mobility,Decreased balance  Visit Diagnosis: Muscle weakness (generalized)  Abnormal posture  Pain in right leg     Problem List Patient Active Problem List   Diagnosis Date Noted  . Pain of cervical facet joint 06/15/2019  . Sciatica of left side associated with disorder of lumbar spine 06/15/2019  . Screening for osteoporosis 05/16/2019  . Screening for colon cancer 05/16/2019  . Encounter for screening mammogram for malignant neoplasm of breast 05/16/2019  . Need for prophylactic vaccination against Streptococcus pneumoniae (pneumococcus) 05/16/2019  . Uncontrolled hypertension 05/16/2019  . Bursitis of hip 05/16/2019  . Abdominal distension (gaseous) 09/04/2016  . Generalized abdominal pain 09/04/2016  . ANXIETY, CHRONIC 06/21/2007  . GERD 06/21/2007  . INTERNAL HEMORRHOIDS 02/07/2001    Sumner Boast., PT 04/23/2020, 10:09 AM  [place]. [place], [place], [address] Phone: [phone]   Fax:  [phone]  Name: Sheri Griffith MRN: [id number] Date of Birth: May 31, 1950

## 2020-04-24 ENCOUNTER — Ambulatory Visit: Payer: Medicare Other | Admitting: Physical Therapy

## 2020-04-25 ENCOUNTER — Telehealth: Payer: Self-pay | Admitting: Adult Health Nurse Practitioner

## 2020-04-25 NOTE — Telephone Encounter (Signed)
Patient was last seen on 04/08/20. Please advise

## 2020-04-25 NOTE — Telephone Encounter (Signed)
Patient has been coughing for a week and has been calling since Monday looking for something to be called in for her cough .\\patient was told taht some one would call her back today.

## 2020-04-25 NOTE — Telephone Encounter (Signed)
Patient called to follow up on recent appointment; still coughing. Nose is congested and phlegm is turning green when she blows her nose. Patient asked for an Rx and she doesn't want the same nose spray prescribed last time; too expensive.    Pharmacy:   [id number] - [place], [address] [place] [address]  Phone:  [phone] Fax:  [phone]  DEA #:  [id number]  Please advise patient at [phone] when medication called into pharmacy.

## 2020-04-26 ENCOUNTER — Other Ambulatory Visit: Payer: Self-pay | Admitting: Registered Nurse

## 2020-04-26 DIAGNOSIS — J301 Allergic rhinitis due to pollen: Secondary | ICD-10-CM

## 2020-04-26 MED ORDER — MONTELUKAST SODIUM 10 MG PO TABS: 10.0000 mg | ORAL_TABLET | Freq: Every day | ORAL | 3 refills | Status: DC

## 2020-04-26 NOTE — Telephone Encounter (Signed)
Two below msg please advise

## 2020-04-26 NOTE — Telephone Encounter (Signed)
Sent a Message to [place] in a secure chat.

## 2020-04-26 NOTE — Telephone Encounter (Signed)
Patient is  Calling back again and patient would like a call back when this is taken care of also, patients  Pharmacy closes at 6:00 patient needs earlier than that .

## 2020-04-30 ENCOUNTER — Other Ambulatory Visit: Payer: Self-pay

## 2020-04-30 ENCOUNTER — Ambulatory Visit: Payer: Medicare Other | Admitting: Physical Therapy

## 2020-04-30 ENCOUNTER — Encounter: Payer: Self-pay | Admitting: Physical Therapy

## 2020-04-30 DIAGNOSIS — M6281 Muscle weakness (generalized): Secondary | ICD-10-CM | POA: Diagnosis not present

## 2020-04-30 DIAGNOSIS — R293 Abnormal posture: Secondary | ICD-10-CM

## 2020-04-30 DIAGNOSIS — M79604 Pain in right leg: Secondary | ICD-10-CM

## 2020-04-30 DIAGNOSIS — G8929 Other chronic pain: Secondary | ICD-10-CM

## 2020-04-30 NOTE — Therapy (Signed)
[place]. [place], [place], [address] Phone: [phone]   Fax:  [phone]  Physical Therapy Treatment  Patient Details  Name: Sheri Griffith MRN: [id number] Date of Birth: Oct 31, 1950 Referring Provider (PT): Maximiano Coss, NP   Encounter Date: 04/30/2020   PT End of Session - 04/30/20 1641    Visit Number 3    Number of Visits 11    Date for PT Re-Evaluation 06/13/20    Authorization Type [employer] MRC    Authorization Time Period p note at 10th visit    PT Start Time 1600    PT Stop Time 1650    PT Time Calculation (min) 50 min    Activity Tolerance Patient tolerated treatment well    Behavior During Therapy [facility] for tasks assessed/performed           Past Medical History:  Diagnosis Date  . Arthritis of knee   . Hypertension   . Sinus problem     Past Surgical History:  Procedure Laterality Date  . NONE      There were no vitals filed for this visit.   Subjective Assessment - 04/30/20 1602    Subjective Doing a little bit better.    Limitations Walking;Standing;Lifting;Sitting    Currently in Pain? Yes    Pain Score 5     Pain Location Leg    Pain Orientation Right;Lateral    Pain Descriptors / Indicators Dull                             [facility] Adult PT Treatment/Exercise - 04/30/20 0001      Lumbar Exercises: Stretches   Passive Hamstring Stretch Right;3 reps;10 seconds    Single Knee to Chest Stretch Right;3 reps;10 seconds    Piriformis Stretch Right;3 reps;10 seconds      Lumbar Exercises: Aerobic   UBE (Upper Arm Bike) L 3 2 fwd/ 2 back    Nustep L 35 min      Lumbar Exercises: Machines for Strengthening   Cybex Lumbar Extension black tband 2 sets 10    Other Lumbar Machine Exercise row 15# 2 sets 10    Other Lumbar Machine Exercise lat pull 15# 2 sets 10      Lumbar Exercises: Standing   Shoulder Extension 20 reps;Theraband;Both    Theraband Level (Shoulder  Extension) Level 2 (Red)      Moist Heat Therapy   Number Minutes Moist Heat 10 Minutes    Moist Heat Location Lumbar Spine                    PT Short Term Goals - 04/23/20 0959      PT SHORT TERM GOAL #1   Title Independent with initial HEP    Status Partially Met             PT Long Term Goals - 04/18/20 1812      PT LONG TERM GOAL #1   Title I with advanced HEP    Time 8    Period Weeks    Status New    Target Date 06/13/20      PT LONG TERM GOAL #2   Title report the ability to perform her ADLs at her prior level of function with minimal to no pain    Time 8    Period Weeks    Status New    Target  Date 06/13/20      PT LONG TERM GOAL #3   Title demo BLE strength =/> 4+/5 to allow her to ambulate up/down stairs without difficulty    Time 8    Period Weeks    Status New    Target Date 06/13/20      PT LONG TERM GOAL #4   Title Pt will report </= 2/10 pain with walking for at least 1 hr  to facilitate return to independent walking program.    Time 8    Period Weeks    Status New    Target Date 06/13/20      PT LONG TERM GOAL #5   Title Pt will score <12 seconds on the TUG with AD  to demo improved functional gait speed and decreased falls risk    Time 8    Period Weeks    Status New    Target Date 06/13/20                 Plan - 04/30/20 1642    Clinical Impression Statement Pt able to tolerate a little more activity compared to last session. Tactile cues for posture needed with seated rows and lats. Cues for core engagement needed with standing shoulder extensions. Pt was really apprehensive to allow passive stretching to RLE, Pt reports pain with Hs stretches, and was nervous to allow single knee to chest.    Personal Factors and Comorbidities Comorbidity 2    Comorbidities HTN, arthirtis    Examination-Activity Limitations Bathing;Bed Mobility;Stairs    Examination-Participation Restrictions Cleaning;Community Activity     Stability/Clinical Decision Making Evolving/Moderate complexity    Rehab Potential Good    PT Duration 8 weeks    PT Treatment/Interventions Iontophoresis 63m/ml Dexamethasone;Stair training;Taping;Patient/family education;Functional mobility training;Moist Heat;Ultrasound;Cryotherapy;Electrical Stimulation;Manual techniques;Therapeutic exercise;Dry needling;ADLs/Self Care Home Management;Therapeutic activities;Neuromuscular re-education    PT Next Visit Plan assess Tx, progress as tolerated           Patient will benefit from skilled therapeutic intervention in order to improve the following deficits and impairments:  Decreased range of motion,Difficulty walking,Pain,Decreased strength,Postural dysfunction,Abnormal gait,Increased muscle spasms,Impaired flexibility,Decreased mobility,Decreased balance  Visit Diagnosis: Abnormal posture  Pain in right leg  Muscle weakness (generalized)  Chronic right-sided low back pain with right-sided sciatica     Problem List Patient Active Problem List   Diagnosis Date Noted  . Pain of cervical facet joint 06/15/2019  . Sciatica of left side associated with disorder of lumbar spine 06/15/2019  . Screening for osteoporosis 05/16/2019  . Screening for colon cancer 05/16/2019  . Encounter for screening mammogram for malignant neoplasm of breast 05/16/2019  . Need for prophylactic vaccination against Streptococcus pneumoniae (pneumococcus) 05/16/2019  . Uncontrolled hypertension 05/16/2019  . Bursitis of hip 05/16/2019  . Abdominal distension (gaseous) 09/04/2016  . Generalized abdominal pain 09/04/2016  . ANXIETY, CHRONIC 06/21/2007  . GERD 06/21/2007  . INTERNAL HEMORRHOIDS 02/07/2001    RScot Jun PTA 04/30/2020, 4:51 PM  [place] [address]Phone: [phone]  Fax:  [phone] Name: Sheri LAYEMRN: [id number] of Birth:  [birth year]

## 2020-05-02 ENCOUNTER — Other Ambulatory Visit: Payer: Self-pay

## 2020-05-02 ENCOUNTER — Ambulatory Visit: Payer: Medicare Other | Admitting: Physical Therapy

## 2020-05-02 ENCOUNTER — Encounter: Payer: Self-pay | Admitting: Physical Therapy

## 2020-05-02 DIAGNOSIS — M79604 Pain in right leg: Secondary | ICD-10-CM

## 2020-05-02 DIAGNOSIS — R293 Abnormal posture: Secondary | ICD-10-CM

## 2020-05-02 DIAGNOSIS — M6281 Muscle weakness (generalized): Secondary | ICD-10-CM | POA: Diagnosis not present

## 2020-05-02 NOTE — Therapy (Signed)
[place]. [place], [place], [address] Phone: [phone]   Fax:  [phone]  Physical Therapy Treatment  Patient Details  Name: Sheri Griffith MRN: [id number] Date of Birth: 10-06-1950 Referring Provider (PT): Maximiano Coss, NP   Encounter Date: 05/02/2020   PT End of Session - 05/02/20 1643    Visit Number 4    Number of Visits 11    Date for PT Re-Evaluation 06/13/20    Authorization Type UHC MRC    PT Start Time 5697    PT Stop Time 9480    PT Time Calculation (min) 45 min    Activity Tolerance Patient tolerated treatment well    Behavior During Therapy [facility] for tasks assessed/performed           Past Medical History:  Diagnosis Date  . Arthritis of knee   . Hypertension   . Sinus problem     Past Surgical History:  Procedure Laterality Date  . NONE      There were no vitals filed for this visit.   Subjective Assessment - 05/02/20 1602    Subjective "Doing ok"    Currently in Pain? Yes    Pain Score 5     Pain Location Leg    Pain Orientation Right;Left                             [facility] Adult PT Treatment/Exercise - 05/02/20 0001      Lumbar Exercises: Aerobic   Nustep L5 x 6 min      Lumbar Exercises: Machines for Strengthening   Cybex Knee Extension 5lb 2x10    Cybex Knee Flexion 15lb 2x10    Other Lumbar Machine Exercise row 20# 2 sets 10      Lumbar Exercises: Standing   Shoulder Extension 20 reps;Theraband;Both    Theraband Level (Shoulder Extension) Level 2 (Red)      Lumbar Exercises: Seated   Sit to Stand 5 reps   x3   Other Seated Lumbar Exercises Pball ab sets 2x10                    PT Short Term Goals - 04/23/20 0959      PT SHORT TERM GOAL #1   Title Independent with initial HEP    Status Partially Met             PT Long Term Goals - 04/18/20 1812      PT LONG TERM GOAL #1   Title I with advanced HEP    Time 8    Period  Weeks    Status New    Target Date 06/13/20      PT LONG TERM GOAL #2   Title report the ability to perform her ADLs at her prior level of function with minimal to no pain    Time 8    Period Weeks    Status New    Target Date 06/13/20      PT LONG TERM GOAL #3   Title demo BLE strength =/> 4+/5 to allow her to ambulate up/down stairs without difficulty    Time 8    Period Weeks    Status New    Target Date 06/13/20      PT LONG TERM GOAL #4   Title Pt will report </= 2/10 pain with walking for at least 1 hr  to  facilitate return to independent walking program.    Time 8    Period Weeks    Status New    Target Date 06/13/20      PT LONG TERM GOAL #5   Title Pt will score <12 seconds on the TUG with AD  to demo improved functional gait speed and decreased falls risk    Time 8    Period Weeks    Status New    Target Date 06/13/20                 Plan - 05/02/20 1644    Clinical Impression Statement Pt did reall well with the addition of LE and core strength. Cues for core activation needed with ad sets. Cues to complete the full ROM with seated curls and extensions. Increase resistance tolerated with rows and lat. Pt is progressing towards goals    Personal Factors and Comorbidities Comorbidity 2    Comorbidities HTN, arthirtis    Examination-Activity Limitations Bathing;Bed Mobility;Stairs    Examination-Participation Restrictions Cleaning;Community Activity    Stability/Clinical Decision Making Evolving/Moderate complexity    Rehab Potential Good    PT Frequency Other (comment)    PT Duration 8 weeks    PT Treatment/Interventions Iontophoresis 76m/ml Dexamethasone;Stair training;Taping;Patient/family education;Functional mobility training;Moist Heat;Ultrasound;Cryotherapy;Electrical Stimulation;Manual techniques;Therapeutic exercise;Dry needling;ADLs/Self Care Home Management;Therapeutic activities;Neuromuscular re-education    PT Next Visit Plan assess Tx,  progress as tolerated           Patient will benefit from skilled therapeutic intervention in order to improve the following deficits and impairments:  Decreased range of motion,Difficulty walking,Pain,Decreased strength,Postural dysfunction,Abnormal gait,Increased muscle spasms,Impaired flexibility,Decreased mobility,Decreased balance  Visit Diagnosis: Abnormal posture  Pain in right leg  Muscle weakness (generalized)     Problem List Patient Active Problem List   Diagnosis Date Noted  . Pain of cervical facet joint 06/15/2019  . Sciatica of left side associated with disorder of lumbar spine 06/15/2019  . Screening for osteoporosis 05/16/2019  . Screening for colon cancer 05/16/2019  . Encounter for screening mammogram for malignant neoplasm of breast 05/16/2019  . Need for prophylactic vaccination against Streptococcus pneumoniae (pneumococcus) 05/16/2019  . Uncontrolled hypertension 05/16/2019  . Bursitis of hip 05/16/2019  . Abdominal distension (gaseous) 09/04/2016  . Generalized abdominal pain 09/04/2016  . ANXIETY, CHRONIC 06/21/2007  . GERD 06/21/2007  . INTERNAL HEMORRHOIDS 02/07/2001    RScot Jun PTA 05/02/2020, 4:48 PM  [place] [address]Phone: [phone]  Fax:  [phone] Name: GVADIS SLABACHMRN: [id number] of Birth: [birth year]

## 2020-05-07 ENCOUNTER — Ambulatory Visit: Payer: Medicare Other | Attending: Registered Nurse

## 2020-05-07 ENCOUNTER — Other Ambulatory Visit: Payer: Self-pay

## 2020-05-07 DIAGNOSIS — M542 Cervicalgia: Secondary | ICD-10-CM | POA: Insufficient documentation

## 2020-05-07 DIAGNOSIS — M25551 Pain in right hip: Secondary | ICD-10-CM | POA: Insufficient documentation

## 2020-05-07 DIAGNOSIS — R2689 Other abnormalities of gait and mobility: Secondary | ICD-10-CM | POA: Insufficient documentation

## 2020-05-07 DIAGNOSIS — M5441 Lumbago with sciatica, right side: Secondary | ICD-10-CM | POA: Diagnosis present

## 2020-05-07 DIAGNOSIS — M6281 Muscle weakness (generalized): Secondary | ICD-10-CM | POA: Insufficient documentation

## 2020-05-07 DIAGNOSIS — G8929 Other chronic pain: Secondary | ICD-10-CM | POA: Diagnosis present

## 2020-05-07 DIAGNOSIS — R293 Abnormal posture: Secondary | ICD-10-CM | POA: Diagnosis present

## 2020-05-07 DIAGNOSIS — M79604 Pain in right leg: Secondary | ICD-10-CM | POA: Insufficient documentation

## 2020-05-07 NOTE — Therapy (Signed)
[place]. [place], [place], [address] Phone: [phone]   Fax:  [phone]  Physical Therapy Treatment  Patient Details  Name: Sheri Griffith MRN: [id number] Date of Birth: [birth year] Referring Provider (PT): Maximiano Coss, NP   Encounter Date: 05/07/2020   PT End of Session - 05/07/20 1542    Visit Number 5    Number of Visits --    Date for PT Re-Evaluation 06/13/20    Authorization Type UHC MRC    PT Start Time 9528    PT Stop Time 4132    PT Time Calculation (min) 42 min    Activity Tolerance Patient tolerated treatment well    Behavior During Therapy [facility] for tasks assessed/performed           Past Medical History:  Diagnosis Date  . Arthritis of knee   . Hypertension   . Sinus problem     Past Surgical History:  Procedure Laterality Date  . NONE      There were no vitals filed for this visit.   Subjective Assessment - 05/07/20 1537    Subjective Doing okay    Limitations Walking;Standing;Lifting;Sitting    Patient Stated Goals decrease pain, go back to walking routine 1 hr 3-4x/wk    Currently in Pain? Yes    Pain Score 5     Pain Location Leg    Pain Orientation Right;Lateral    Pain Descriptors / Indicators Dull    Pain Type Chronic pain                [facility] Adult PT Treatment/Exercise - 05/07/20 0001      Lumbar Exercises: Aerobic   Nustep L5 x 6 min      Lumbar Exercises: Machines for Strengthening   Cybex Knee Extension 5lb 2x10    Cybex Knee Flexion 15lb 2x10    Other Lumbar Machine Exercise row 20# 2 sets 10      Lumbar Exercises: Standing   Shoulder Extension 20 reps;Theraband;Both    Theraband Level (Shoulder Extension) Level 2 (Red)      Lumbar Exercises: Seated   Sit to Stand 5 reps   x3   Other Seated Lumbar Exercises Pball ab sets 2x10      Knee/Hip Exercises: Standing   Walking with Sports Cord 20# 4 way 1x each with close guardng             PT  Short Term Goals - 04/23/20 0959      PT SHORT TERM GOAL #1   Title Independent with initial HEP    Status Partially Met             PT Long Term Goals - 04/18/20 1812      PT LONG TERM GOAL #1   Title I with advanced HEP    Time 8    Period Weeks    Status New    Target Date 06/13/20      PT LONG TERM GOAL #2   Title report the ability to perform her ADLs at her prior level of function with minimal to no pain    Time 8    Period Weeks    Status New    Target Date 06/13/20      PT LONG TERM GOAL #3   Title demo BLE strength =/> 4+/5 to allow her to ambulate up/down stairs without difficulty    Time 8    Period Weeks  Status New    Target Date 06/13/20      PT LONG TERM GOAL #4   Title Pt will report </= 2/10 pain with walking for at least 1 hr  to facilitate return to independent walking program.    Time 8    Period Weeks    Status New    Target Date 06/13/20      PT LONG TERM GOAL #5   Title Pt will score <12 seconds on the TUG with AD  to demo improved functional gait speed and decreased falls risk    Time 8    Period Weeks    Status New    Target Date 06/13/20                 Plan - 05/07/20 1545    Clinical Impression Statement Pt continues to do nicely with exercises. Reports not so much low back pain but some dull pain in the lateral right thigh. introduced light resisted walking today with overall good tolerance but most diffiuclty with lateral stepping due to weakness and some pain. Will benefit from continued strenght ang mobility exercises to decrease pain and improve fxn    Personal Factors and Comorbidities Comorbidity 2    Comorbidities HTN, arthirtis    Examination-Activity Limitations Bathing;Bed Mobility;Stairs    Examination-Participation Restrictions Cleaning;Community Activity    Rehab Potential Good    PT Duration 8 weeks    PT Treatment/Interventions Iontophoresis 1m/ml Dexamethasone;Stair training;Taping;Patient/family  education;Functional mobility training;Moist Heat;Ultrasound;Cryotherapy;Electrical Stimulation;Manual techniques;Therapeutic exercise;Dry needling;ADLs/Self Care Home Management;Therapeutic activities;Neuromuscular re-education    PT Next Visit Plan assess Tx, progress as tolerated    Consulted and Agree with Plan of Care Patient           Patient will benefit from skilled therapeutic intervention in order to improve the following deficits and impairments:  Decreased range of motion,Difficulty walking,Pain,Decreased strength,Postural dysfunction,Abnormal gait,Increased muscle spasms,Impaired flexibility,Decreased mobility,Decreased balance  Visit Diagnosis: Abnormal posture  Pain in right leg  Muscle weakness (generalized)  Chronic right-sided low back pain with right-sided sciatica  Other abnormalities of gait and mobility  Cervicalgia  Pain in right hip     Problem List Patient Active Problem List   Diagnosis Date Noted  . Pain of cervical facet joint 06/15/2019  . Sciatica of left side associated with disorder of lumbar spine 06/15/2019  . Screening for osteoporosis 05/16/2019  . Screening for colon cancer 05/16/2019  . Encounter for screening mammogram for malignant neoplasm of breast 05/16/2019  . Need for prophylactic vaccination against Streptococcus pneumoniae (pneumococcus) 05/16/2019  . Uncontrolled hypertension 05/16/2019  . Bursitis of hip 05/16/2019  . Abdominal distension (gaseous) 09/04/2016  . Generalized abdominal pain 09/04/2016  . ANXIETY, CHRONIC 06/21/2007  . GERD 06/21/2007  . INTERNAL HEMORRHOIDS 02/07/2001    MHall Busing PT, DPT 05/07/2020, 6:08 PM  [place] [address]Phone: [phone]  Fax:  [phone] Name: Sheri TAUSSIGMRN: [id number] of Birth: 3Jul 03, 1952

## 2020-05-09 ENCOUNTER — Encounter: Payer: Self-pay | Admitting: Physical Therapy

## 2020-05-09 ENCOUNTER — Ambulatory Visit: Payer: Medicare Other | Admitting: Physical Therapy

## 2020-05-09 ENCOUNTER — Other Ambulatory Visit: Payer: Self-pay

## 2020-05-09 DIAGNOSIS — M6281 Muscle weakness (generalized): Secondary | ICD-10-CM

## 2020-05-09 DIAGNOSIS — R293 Abnormal posture: Secondary | ICD-10-CM

## 2020-05-09 DIAGNOSIS — M79604 Pain in right leg: Secondary | ICD-10-CM

## 2020-05-09 NOTE — Therapy (Signed)
[address]. [place], [place], [address] Phone: [phone]   Fax:  [phone]  Physical Therapy Treatment  Patient Details  Name: Sheri Griffith MRN: [id number] Date of Birth: 1950-12-31 Referring Provider (PT): Sheri Coss, NP   Encounter Date: 05/09/2020   PT End of Session - 05/09/20 1643    Visit Number 6    Number of Visits 11    Date for PT Re-Evaluation 06/13/20    Authorization Type UHC MRC    PT Start Time 1600    PT Stop Time 0109    PT Time Calculation (min) 45 min    Activity Tolerance Patient tolerated treatment well    Behavior During Therapy [facility] for tasks assessed/performed           Past Medical History:  Diagnosis Date  . Arthritis of knee   . Hypertension   . Sinus problem     Past Surgical History:  Procedure Laterality Date  . NONE      There were no vitals filed for this visit.   Subjective Assessment - 05/09/20 1603    Subjective "OK, I wish I could walk a little bit faster"    Currently in Pain? Yes    Pain Score 4     Pain Location Leg    Pain Orientation Right                             [facility] Adult PT Treatment/Exercise - 05/09/20 0001      Lumbar Exercises: Aerobic   Nustep L5 x 7 min      Lumbar Exercises: Machines for Strengthening   Cybex Knee Flexion 20lb 2x10      Lumbar Exercises: Standing   Row Theraband;20 reps;Strengthening    Shoulder Extension 20 reps;Both;Strengthening    Shoulder Extension Limitations 5      Knee/Hip Exercises: Standing   Forward Step Up Both;2 sets;5 reps;Hand Hold: 0;Step Height: 4"    Walking with Sports Cord 20lb side step x3 each                    PT Short Term Goals - 05/09/20 1643      PT SHORT TERM GOAL #1   Title Independent with initial HEP    Status Achieved             PT Long Term Goals - 05/09/20 1644      PT LONG TERM GOAL #1   Title I with advanced HEP    Status  Partially Met      PT LONG TERM GOAL #2   Title report the ability to perform her ADLs at her prior level of function with minimal to no pain    Status Partially Met      PT LONG TERM GOAL #3   Title demo BLE strength =/> 4+/5 to allow her to ambulate up/down stairs without difficulty    Status On-going      PT LONG TERM GOAL #4   Title Pt will report </= 2/10 pain with walking for at least 1 hr  to facilitate return to independent walking program.    Status On-going                 Plan - 05/09/20 1644    Clinical Impression Statement Good carryover from las session tolerating a more active session. Some postural core weakness present  with standing shoulder extensions. She did well with step ups showing good balance. Reports some tightness in lateral R shaft with side steps. Cues needed to squeeze shoulder blades together with standing rows.    Personal Factors and Comorbidities Comorbidity 2    Comorbidities HTN, arthirtis    Examination-Activity Limitations Bathing;Bed Mobility;Stairs    Examination-Participation Restrictions Cleaning;Community Activity    Stability/Clinical Decision Making Evolving/Moderate complexity    Rehab Potential Good    PT Duration 8 weeks    PT Treatment/Interventions Iontophoresis 66m/ml Dexamethasone;Stair training;Taping;Patient/family education;Functional mobility training;Moist Heat;Ultrasound;Cryotherapy;Electrical Stimulation;Manual techniques;Therapeutic exercise;Dry needling;ADLs/Self Care Home Management;Therapeutic activities;Neuromuscular re-education    PT Next Visit Plan assess Tx, progress as tolerated           Patient will benefit from skilled therapeutic intervention in order to improve the following deficits and impairments:  Decreased range of motion,Difficulty walking,Pain,Decreased strength,Postural dysfunction,Abnormal gait,Increased muscle spasms,Impaired flexibility,Decreased mobility,Decreased balance  Visit  Diagnosis: Muscle weakness (generalized)  Pain in right leg  Abnormal posture     Problem List Patient Active Problem List   Diagnosis Date Noted  . Pain of cervical facet joint 06/15/2019  . Sciatica of left side associated with disorder of lumbar spine 06/15/2019  . Screening for osteoporosis 05/16/2019  . Screening for colon cancer 05/16/2019  . Encounter for screening mammogram for malignant neoplasm of breast 05/16/2019  . Need for prophylactic vaccination against Streptococcus pneumoniae (pneumococcus) 05/16/2019  . Uncontrolled hypertension 05/16/2019  . Bursitis of hip 05/16/2019  . Abdominal distension (gaseous) 09/04/2016  . Generalized abdominal pain 09/04/2016  . ANXIETY, CHRONIC 06/21/2007  . GERD 06/21/2007  . INTERNAL HEMORRHOIDS 02/07/2001    RScot Jun PTA 05/09/2020, 4:48 PM  [place] [address]Phone: [phone]  Fax:  [phone] Name: Sheri FAULKSMRN: [id number] of Birth: [birth year]

## 2020-05-22 ENCOUNTER — Other Ambulatory Visit: Payer: Self-pay

## 2020-05-22 ENCOUNTER — Ambulatory Visit: Payer: Medicare Other | Admitting: Physical Therapy

## 2020-05-22 DIAGNOSIS — M25551 Pain in right hip: Secondary | ICD-10-CM

## 2020-05-22 DIAGNOSIS — G8929 Other chronic pain: Secondary | ICD-10-CM

## 2020-05-22 DIAGNOSIS — R293 Abnormal posture: Secondary | ICD-10-CM | POA: Diagnosis not present

## 2020-05-22 DIAGNOSIS — M79604 Pain in right leg: Secondary | ICD-10-CM

## 2020-05-22 DIAGNOSIS — M6281 Muscle weakness (generalized): Secondary | ICD-10-CM

## 2020-05-22 NOTE — Therapy (Signed)
[place]. [place], [place], [address] Phone: [phone]   Fax:  [phone]  Physical Therapy Treatment  Patient Details  Name: Sheri Griffith MRN: [id number] Date of Birth: February 12, 1950 Referring Provider (PT): Maximiano Coss, NP   Encounter Date: 05/22/2020   PT End of Session - 05/22/20 1158    Visit Number 7    Number of Visits 11    Date for PT Re-Evaluation 06/13/20    PT Start Time 1153    PT Stop Time 1230    PT Time Calculation (min) 37 min    Activity Tolerance Patient tolerated treatment well    Behavior During Therapy [facility] for tasks assessed/performed           Past Medical History:  Diagnosis Date  . Arthritis of knee   . Hypertension   . Sinus problem     Past Surgical History:  Procedure Laterality Date  . NONE      There were no vitals filed for this visit.   Subjective Assessment - 05/22/20 1153    Subjective Doing good. Started back walking routine this morning and it went well.    Currently in Pain? Yes    Pain Score 5     Pain Location Leg    Pain Orientation Right;Upper;Lateral                             [facility] Adult PT Treatment/Exercise - 05/22/20 0001      Lumbar Exercises: Aerobic   Nustep L5 x 41mn      Lumbar Exercises: Machines for Strengthening   Cybex Knee Extension 5# 2x10    Cybex Knee Flexion 20# 2x10    Other Lumbar Machine Exercise rows & lats 20# 2 sets 10      Knee/Hip Exercises: Standing   Hip ADduction Both;2 sets;10 reps    Hip ADduction Limitations Red TBand    Hip Extension Both;2 sets;10 reps    Extension Limitations Red Tband    Forward Step Up Both;20 reps;Hand Hold: 0;Step Height: 4"    Walking with Sports Cord 20# 4way x5    Other Standing Knee Exercises 6" runner step up HHx2 x 5 each side                    PT Short Term Goals - 05/09/20 1643      PT SHORT TERM GOAL #1   Title Independent with initial HEP     Status Achieved             PT Long Term Goals - 05/22/20 1234      PT LONG TERM GOAL #1   Title I with advanced HEP    Status Partially Met      PT LONG TERM GOAL #2   Title report the ability to perform her ADLs at her prior level of function with minimal to no pain    Status Partially Met      PT LONG TERM GOAL #4   Title Pt will report </= 2/10 pain with walking for at least 1 hr  to facilitate return to independent walking program.    Status On-going                 Plan - 05/22/20 1230    Clinical Impression Statement Pt tolerated treatment well with no increase in pain and completeling all TE. She  continues to do well with step ups, adding runner step ups with good tolerance and balance. Some fatigue noted with resisted side to side stepping with cues needed for larger more even steps.    PT Treatment/Interventions Iontophoresis 16m/ml Dexamethasone;Stair training;Taping;Patient/family education;Functional mobility training;Moist Heat;Ultrasound;Cryotherapy;Electrical Stimulation;Manual techniques;Therapeutic exercise;Dry needling;ADLs/Self Care Home Management;Therapeutic activities;Neuromuscular re-education    PT Next Visit Plan continue to progress TE, balance, and gait astolerated and indicated.           Patient will benefit from skilled therapeutic intervention in order to improve the following deficits and impairments:  Decreased range of motion,Difficulty walking,Pain,Decreased strength,Postural dysfunction,Abnormal gait,Increased muscle spasms,Impaired flexibility,Decreased mobility,Decreased balance  Visit Diagnosis: Muscle weakness (generalized)  Pain in right leg  Chronic right-sided low back pain with right-sided sciatica  Abnormal posture  Pain in right hip     Problem List Patient Active Problem List   Diagnosis Date Noted  . Pain of cervical facet joint 06/15/2019  . Sciatica of left side associated with disorder of lumbar spine  06/15/2019  . Screening for osteoporosis 05/16/2019  . Screening for colon cancer 05/16/2019  . Encounter for screening mammogram for malignant neoplasm of breast 05/16/2019  . Need for prophylactic vaccination against Streptococcus pneumoniae (pneumococcus) 05/16/2019  . Uncontrolled hypertension 05/16/2019  . Bursitis of hip 05/16/2019  . Abdominal distension (gaseous) 09/04/2016  . Generalized abdominal pain 09/04/2016  . ANXIETY, CHRONIC 06/21/2007  . GERD 06/21/2007  . INTERNAL HEMORRHOIDS 02/07/2001    AErnst Spell SDayle Points4/20/2022, 12:35 PM  [place] [address]Phone: [phone]  Fax:  [phone] Name: GKANDA DELUNAMRN: [id number] of Birth: [birth year]

## 2020-05-24 ENCOUNTER — Other Ambulatory Visit: Payer: Self-pay

## 2020-05-24 ENCOUNTER — Encounter: Payer: Self-pay | Admitting: Physical Therapy

## 2020-05-24 ENCOUNTER — Ambulatory Visit: Payer: Medicare Other | Admitting: Physical Therapy

## 2020-05-24 DIAGNOSIS — M79604 Pain in right leg: Secondary | ICD-10-CM

## 2020-05-24 DIAGNOSIS — R293 Abnormal posture: Secondary | ICD-10-CM

## 2020-05-24 DIAGNOSIS — G8929 Other chronic pain: Secondary | ICD-10-CM

## 2020-05-24 DIAGNOSIS — M6281 Muscle weakness (generalized): Secondary | ICD-10-CM

## 2020-05-24 NOTE — Therapy (Signed)
[place]. [place], [place], [address] Phone: [phone]   Fax:  [phone]  Physical Therapy Treatment  Patient Details  Name: Sheri Griffith MRN: [id number] Date of Birth: March 24, 1950 Referring Provider (PT): Maximiano Coss, NP   Encounter Date: 05/24/2020   PT End of Session - 05/24/20 0927    Visit Number 8    Date for PT Re-Evaluation 06/13/20    Authorization Type UHC MRC    PT Start Time 4944    PT Stop Time 0929    PT Time Calculation (min) 42 min    Activity Tolerance Patient tolerated treatment well    Behavior During Therapy [facility] for tasks assessed/performed           Past Medical History:  Diagnosis Date  . Arthritis of knee   . Hypertension   . Sinus problem     Past Surgical History:  Procedure Laterality Date  . NONE      There were no vitals filed for this visit.   Subjective Assessment - 05/24/20 0854    Subjective Started back walking and its going ok, Reports some dullness in R leg    Currently in Pain? No/denies    Pain Location Mediastinum    Pain Orientation Right    Pain Descriptors / Indicators Dull                             [facility] Adult PT Treatment/Exercise - 05/24/20 0001      Lumbar Exercises: Aerobic   UBE (Upper Arm Bike) L 1.1 2 fwd/ 2 back      Lumbar Exercises: Machines for Strengthening   Cybex Knee Extension 5# 3x10    Cybex Knee Flexion 20# 2x15    Other Lumbar Machine Exercise rows & lats 20# 2 sets 10      Lumbar Exercises: Standing   Shoulder Extension 20 reps;Both;Strengthening    Shoulder Extension Limitations 5                    PT Short Term Goals - 05/09/20 1643      PT SHORT TERM GOAL #1   Title Independent with initial HEP    Status Achieved             PT Long Term Goals - 05/22/20 1234      PT LONG TERM GOAL #1   Title I with advanced HEP    Status Partially Met      PT LONG TERM GOAL #2    Title report the ability to perform her ADLs at her prior level of function with minimal to no pain    Status Partially Met      PT LONG TERM GOAL #4   Title Pt will report </= 2/10 pain with walking for at least 1 hr  to facilitate return to independent walking program.    Status On-going                 Plan - 05/24/20 9675    Clinical Impression Statement Pt stated that she has returned to walking three days a well. No report of pain but does have some RLE dullness especially after prolong sitting. Usual resistance felt heavy with seated rows and lats so a lighter weight was selected. No reports of increase pain today. Increase reps tolerated with seated HS curls and extensions.    Personal Factors  and Comorbidities Comorbidity 2    Comorbidities HTN, arthirtis    Examination-Participation Restrictions Cleaning;Community Activity    Stability/Clinical Decision Making Evolving/Moderate complexity    Rehab Potential Good    PT Duration 8 weeks    PT Treatment/Interventions Iontophoresis 15m/ml Dexamethasone;Stair training;Taping;Patient/family education;Functional mobility training;Moist Heat;Ultrasound;Cryotherapy;Electrical Stimulation;Manual techniques;Therapeutic exercise;Dry needling;ADLs/Self Care Home Management;Therapeutic activities;Neuromuscular re-education    PT Next Visit Plan continue to progress TE, balance, and gait astolerated and indicated.           Patient will benefit from skilled therapeutic intervention in order to improve the following deficits and impairments:     Visit Diagnosis: Pain in right leg  Chronic right-sided low back pain with right-sided sciatica  Muscle weakness (generalized)  Abnormal posture     Problem List Patient Active Problem List   Diagnosis Date Noted  . Pain of cervical facet joint 06/15/2019  . Sciatica of left side associated with disorder of lumbar spine 06/15/2019  . Screening for osteoporosis 05/16/2019  .  Screening for colon cancer 05/16/2019  . Encounter for screening mammogram for malignant neoplasm of breast 05/16/2019  . Need for prophylactic vaccination against Streptococcus pneumoniae (pneumococcus) 05/16/2019  . Uncontrolled hypertension 05/16/2019  . Bursitis of hip 05/16/2019  . Abdominal distension (gaseous) 09/04/2016  . Generalized abdominal pain 09/04/2016  . ANXIETY, CHRONIC 06/21/2007  . GERD 06/21/2007  . INTERNAL HEMORRHOIDS 02/07/2001    RScot Jun PTA 05/24/2020, 9:32 AM  [place] [address]Phone: [phone]  Fax:  [phone] Name: Sheri SEENEYMRN: [id number] of Birth: 302-24-1952

## 2020-05-28 ENCOUNTER — Ambulatory Visit: Payer: Medicare Other | Admitting: Physical Therapy

## 2020-05-30 ENCOUNTER — Ambulatory Visit: Payer: Medicare Other | Admitting: Physical Therapy

## 2020-06-12 ENCOUNTER — Other Ambulatory Visit: Payer: Self-pay

## 2020-06-12 ENCOUNTER — Ambulatory Visit: Payer: Medicare Other | Admitting: Podiatry

## 2020-06-12 DIAGNOSIS — M79675 Pain in left toe(s): Secondary | ICD-10-CM | POA: Diagnosis not present

## 2020-06-12 DIAGNOSIS — B351 Tinea unguium: Secondary | ICD-10-CM

## 2020-06-12 DIAGNOSIS — M79674 Pain in right toe(s): Secondary | ICD-10-CM

## 2020-06-14 ENCOUNTER — Encounter: Payer: Self-pay | Admitting: Podiatry

## 2020-06-14 NOTE — Progress Notes (Signed)
  Subjective:  Patient ID: Sheri Griffith, female    DOB: 10-Jul-1950,  MRN: [id number]  Chief Complaint  Patient presents with  . Nail Problem    Nail discoloration    70 y.o. female returns for the above complaint.  Patient presents with thickened elongated dystrophic toenails x10.  Painful to touch.  She would like to have them debrided out as she is not able to do it herself.  She is not a diabetic.  She denies any other acute complaints.  Objective:  There were no vitals filed for this visit. Podiatric Exam: Vascular: dorsalis pedis and posterior tibial pulses are palpable bilateral. Capillary return is immediate. Temperature gradient is WNL. Skin turgor WNL  Sensorium: Normal Semmes Weinstein monofilament test. Normal tactile sensation bilaterally. Nail Exam: Pt has thick disfigured discolored nails with subungual debris noted bilateral entire nail hallux through fifth toenails.  Pain on palpation to the nails. Ulcer Exam: There is no evidence of ulcer or pre-ulcerative changes or infection. Orthopedic Exam: Muscle tone and strength are WNL. No limitations in general ROM. No crepitus or effusions noted. HAV  B/L.  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 Skin: No Porokeratosis. No infection or ulcers    Assessment & Plan:   1. Pain due to onychomycosis of toenails of both feet     Patient was evaluated and treated and all questions answered.  Onychomycosis with pain  -Nails palliatively debrided as below. -Educated on self-care  Procedure: Nail Debridement Rationale: pain  Type of Debridement: manual, sharp debridement. Instrumentation: Nail nipper, rotary burr. Number of Nails: 10  Procedures and Treatment: Consent by patient was obtained for treatment procedures. The patient understood the discussion of treatment and procedures well. All questions were answered thoroughly reviewed. Debridement of mycotic and hypertrophic toenails, 1 through 5 bilateral and clearing of subungual debris. No  ulceration, no infection noted.  Return Visit-Office Procedure: Patient instructed to return to the office for a follow up visit 3 months for continued evaluation and treatment.  Boneta Lucks, DPM    No follow-ups on file.

## 2020-07-19 ENCOUNTER — Encounter: Payer: Self-pay | Admitting: Registered Nurse

## 2020-07-19 ENCOUNTER — Other Ambulatory Visit: Payer: Self-pay

## 2020-07-19 ENCOUNTER — Ambulatory Visit (INDEPENDENT_AMBULATORY_CARE_PROVIDER_SITE_OTHER): Payer: Medicare Other | Admitting: Registered Nurse

## 2020-07-19 VITALS — BP 142/90 | HR 79 | Temp 97.2°F | Ht 64.0 in | Wt 194.5 lb

## 2020-07-19 DIAGNOSIS — Z130 Encounter for screening for diseases of the blood and blood-forming organs and certain disorders involving the immune mechanism: Secondary | ICD-10-CM | POA: Diagnosis not present

## 2020-07-19 DIAGNOSIS — M8580 Other specified disorders of bone density and structure, unspecified site: Secondary | ICD-10-CM

## 2020-07-19 DIAGNOSIS — Z1329 Encounter for screening for other suspected endocrine disorder: Secondary | ICD-10-CM | POA: Diagnosis not present

## 2020-07-19 DIAGNOSIS — G8929 Other chronic pain: Secondary | ICD-10-CM

## 2020-07-19 DIAGNOSIS — Z13228 Encounter for screening for other metabolic disorders: Secondary | ICD-10-CM

## 2020-07-19 DIAGNOSIS — Z13220 Encounter for screening for lipoid disorders: Secondary | ICD-10-CM | POA: Diagnosis not present

## 2020-07-19 DIAGNOSIS — M859 Disorder of bone density and structure, unspecified: Secondary | ICD-10-CM

## 2020-07-19 DIAGNOSIS — M25562 Pain in left knee: Secondary | ICD-10-CM | POA: Diagnosis not present

## 2020-07-19 LAB — VITAMIN D 25 HYDROXY (VIT D DEFICIENCY, FRACTURES): VITD: 18.66 ng/mL — ABNORMAL LOW (ref 30.00–100.00)

## 2020-07-19 LAB — COMPREHENSIVE METABOLIC PANEL
ALT: 14 U/L (ref 0–35)
AST: 15 U/L (ref 0–37)
Albumin: 4.5 g/dL (ref 3.5–5.2)
Alkaline Phosphatase: 60 U/L (ref 39–117)
BUN: 11 mg/dL (ref 6–23)
CO2: 27 mEq/L (ref 19–32)
Calcium: 9.7 mg/dL (ref 8.4–10.5)
Chloride: 104 mEq/L (ref 96–112)
Creatinine, Ser: 0.9 mg/dL (ref 0.40–1.20)
GFR: 65.33 mL/min (ref >60.00)
Glucose, Bld: 87 mg/dL (ref 70–99)
Potassium: 4.3 mEq/L (ref 3.5–5.1)
Sodium: 140 mEq/L (ref 135–145)
Total Bilirubin: 0.8 mg/dL (ref 0.2–1.2)
Total Protein: 6.9 g/dL (ref 6.0–8.3)

## 2020-07-19 LAB — CBC WITH DIFFERENTIAL/PLATELET
Basophils Absolute: 0 10*3/uL (ref 0.0–0.1)
Basophils Relative: 0.4 % (ref 0.0–3.0)
Eosinophils Absolute: 0.1 10*3/uL (ref 0.0–0.7)
Eosinophils Relative: 3.1 % (ref 0.0–5.0)
HCT: 41.7 % (ref 36.0–46.0)
Hemoglobin: 14 g/dL (ref 12.0–15.0)
Lymphocytes Relative: 56.3 % — ABNORMAL HIGH (ref 12.0–46.0)
Lymphs Abs: 2.2 10*3/uL (ref 0.7–4.0)
MCHC: 33.6 g/dL (ref 30.0–36.0)
MCV: 87.7 fl (ref 78.0–100.0)
Monocytes Absolute: 0.3 10*3/uL (ref 0.1–1.0)
Monocytes Relative: 8.7 % (ref 3.0–12.0)
Neutro Abs: 1.2 10*3/uL — ABNORMAL LOW (ref 1.4–7.7)
Neutrophils Relative %: 31.5 % — ABNORMAL LOW (ref 43.0–77.0)
Platelets: 182 10*3/uL (ref 150.0–400.0)
RBC: 4.76 Mil/uL (ref 3.87–5.11)
RDW: 13.2 % (ref 11.5–15.5)
WBC: 3.9 10*3/uL — ABNORMAL LOW (ref 4.0–10.5)

## 2020-07-19 LAB — HEMOGLOBIN A1C: Hgb A1c MFr Bld: 6 % (ref 4.6–6.5)

## 2020-07-19 LAB — LIPID PANEL
Cholesterol: 210 mg/dL — ABNORMAL HIGH (ref 0–200)
HDL: 71.7 mg/dL (ref >39.00)
LDL Cholesterol: 122 mg/dL — ABNORMAL HIGH (ref 0–99)
NonHDL: 138.42
Total CHOL/HDL Ratio: 3
Triglycerides: 81 mg/dL (ref 0.0–149.0)
VLDL: 16.2 mg/dL (ref 0.0–40.0)

## 2020-07-19 LAB — TSH: TSH: 2.19 u[IU]/mL (ref 0.35–4.50)

## 2020-07-19 NOTE — Progress Notes (Signed)
Acute Office Visit  Subjective:    Patient ID: Sheri Griffith, female    DOB: 1951-01-26, 70 y.o.   MRN: [id number]  Chief Complaint  Patient presents with   Follow-up   Knee Pain    HPI Patient is in today for L knee pain  Thinks she hurt it by favoring this leg when R leg was hurting Dg l knee from 2016 shows mild degenerative chagnes Some stiffness, aching, soreness with walking and weightbearing Better at rest   No recent injury or trauma Denies swelling No instability or falls  Past Medical History:  Diagnosis Date   Arthritis of knee    Hypertension    Sinus problem     Past Surgical History:  Procedure Laterality Date   NONE      Family History  Problem Relation Age of Onset   Other Mother        staph infection   Other Father        died age 5   Colon cancer Neg Hx    Rectal cancer Neg Hx    Esophageal cancer Neg Hx    Liver cancer Neg Hx     Social History   Socioeconomic History   Marital status: Married    Spouse name: Not on file   Number of children: 2   Years of education: Not on file   Highest education level: Not on file  Occupational History   Occupation: Oceanographer  Tobacco Use   Smoking status: Never   Smokeless tobacco: Never  Substance and Sexual Activity   Alcohol use: Yes    Comment: occasional use   Drug use: No   Sexual activity: Yes    Birth control/protection: None  Other Topics Concern   Not on file  Social History Narrative   Not on file   Social Determinants of Health   Financial Resource Strain: Not on file  Food Insecurity: Not on file  Transportation Needs: Not on file  Physical Activity: Not on file  Stress: Not on file  Social Connections: Not on file  Intimate Partner Violence: Not on file    Outpatient Medications Prior to Visit  Medication Sig Dispense Refill   azelastine (ASTELIN) 0.1 % nasal spray Place 1 spray into both nostrils 2 (two) times daily. Use in each nostril as directed 30  mL 12   cyclobenzaprine (FLEXERIL) 5 MG tablet Take 1 tablet (5 mg total) by mouth 3 (three) times daily as needed for muscle spasms. 30 tablet 1   gabapentin (NEURONTIN) 100 MG capsule Take 1 capsule (100 mg total) by mouth 3 (three) times daily. 90 capsule 3   montelukast (SINGULAIR) 10 MG tablet Take 1 tablet (10 mg total) by mouth at bedtime. 30 tablet 3   No facility-administered medications prior to visit.    Allergies  Allergen Reactions   Procaine Hcl    Cortisone Hives   Lidocaine Rash    Review of Systems Per hpi      Objective:    Physical Exam Vitals and nursing note reviewed.  Constitutional:      General: She is not in acute distress.    Appearance: Normal appearance. She is not ill-appearing, toxic-appearing or diaphoretic.  Cardiovascular:     Rate and Rhythm: Normal rate and regular rhythm.     Pulses: Normal pulses.     Heart sounds: Normal heart sounds. No murmur heard.   No friction rub. No gallop.  Pulmonary:  Effort: Pulmonary effort is normal. No respiratory distress.     Breath sounds: Normal breath sounds. No stridor. No wheezing, rhonchi or rales.  Chest:     Chest wall: No tenderness.  Musculoskeletal:        General: Swelling (mild effusion around knee) and tenderness (joint line knee) present. No deformity or signs of injury. Normal range of motion.     Right lower leg: No edema.     Left lower leg: No edema.  Skin:    General: Skin is warm and dry.     Capillary Refill: Capillary refill takes less than 2 seconds.  Neurological:     General: No focal deficit present.     Mental Status: She is alert and oriented to person, place, and time. Mental status is at baseline.  Psychiatric:        Mood and Affect: Mood normal.        Behavior: Behavior normal.        Thought Content: Thought content normal.        Judgment: Judgment normal.    BP (!) 142/90   Pulse 79   Temp (!) 97.2 F (36.2 C)   Ht 5\' 4"  (1.626 m)   Wt 194 lb 8 oz  (88.2 kg)   SpO2 97%   BMI 33.39 kg/m  Wt Readings from Last 3 Encounters:  11/06/20 194 lb 14.4 oz (88.4 kg)  10/22/20 193 lb 3.2 oz (87.6 kg)  07/19/20 194 lb 8 oz (88.2 kg)    Health Maintenance Due  Topic Date Due   COVID-19 Vaccine (1) Never done    There are no preventive care reminders to display for this patient.   Lab Results  Component Value Date   TSH 2.19 07/19/2020   Lab Results  Component Value Date   WBC 3.3 (L) 10/22/2020   HGB 13.5 10/22/2020   HCT 40.7 10/22/2020   MCV 88.1 10/22/2020   PLT 171.0 10/22/2020   Lab Results  Component Value Date   NA 138 10/31/2020   K 4.1 10/31/2020   CO2 27 10/31/2020   GLUCOSE 90 10/31/2020   BUN 18 10/31/2020   CREATININE 1.05 10/31/2020   BILITOT 0.8 07/19/2020   ALKPHOS 60 07/19/2020   AST 15 07/19/2020   ALT 14 07/19/2020   PROT 6.9 07/19/2020   ALBUMIN 4.5 07/19/2020   CALCIUM 9.9 10/31/2020   GFR 54.19 (L) 10/31/2020   Lab Results  Component Value Date   CHOL 210 (H) 07/19/2020   Lab Results  Component Value Date   HDL 71.70 07/19/2020   Lab Results  Component Value Date   LDLCALC 122 (H) 07/19/2020   Lab Results  Component Value Date   TRIG 81.0 07/19/2020   Lab Results  Component Value Date   CHOLHDL 3 07/19/2020   Lab Results  Component Value Date   HGBA1C 6.0 07/19/2020       Assessment & Plan:   Problem List Items Addressed This Visit   None Visit Diagnoses     Low bone density    -  Primary   Relevant Orders   Vitamin D (25 hydroxy) (Completed)   Chronic pain of left knee       Relevant Orders   DG Knee Complete 4 Views Left   Screening for endocrine, metabolic and immunity disorder       Relevant Orders   CBC with Differential/Platelet (Completed)   Hemoglobin A1c (Completed)   Comprehensive metabolic panel (Completed)  TSH (Completed)   Lipid screening       Relevant Orders   Lipid panel (Completed)        No orders of the defined types were placed in  this encounter.  PLAN Labs collected. Will follow up with the patient as warranted. Suspect worsening OA. Will obtain xray. Suggest OTC and nonpharm relief until xray results in - then can determine whether PT, ortho, or conservative measures are more reasonable. Patient encouraged to call clinic with any questions, comments, or concerns.   Maximiano Coss, NP

## 2020-07-19 NOTE — Patient Instructions (Signed)
[name] to see you  Knee Take meloxicam once daily for inflammation, stiffness, and pain This can help with the costochondritis as well We will get you scheduled at our [place] office for an xray. I'll be in touch with results  Labs Blood counts, liver and kidney function, sugars, cholesterol, thyroid, and vitamin d. Results expected within a week. I'll call with concerns, if they're normal, I'll get a letter in the mail. We can plan on follow up based on those lab results  Bone density Calcium and vitamin d - recommend a daily supplement of at least 1000 units of vitamin d daily We will check vitamin d levels today Regular weight bearing exercise helps promote bone density   Let me know if you have questions! We'll speak soon  Thank you  Rihc

## 2020-07-23 ENCOUNTER — Ambulatory Visit: Payer: Medicare Other | Admitting: Registered Nurse

## 2020-07-23 ENCOUNTER — Other Ambulatory Visit: Payer: Self-pay | Admitting: Registered Nurse

## 2020-07-23 DIAGNOSIS — E559 Vitamin D deficiency, unspecified: Secondary | ICD-10-CM

## 2020-07-23 MED ORDER — VITAMIN D (ERGOCALCIFEROL) 1.25 MG (50000 UNIT) PO CAPS: 50000.0000 [IU] | ORAL_CAPSULE | ORAL | 0 refills | Status: DC

## 2020-08-01 ENCOUNTER — Telehealth: Payer: Self-pay | Admitting: Registered Nurse

## 2020-08-01 ENCOUNTER — Other Ambulatory Visit: Payer: Self-pay | Admitting: Registered Nurse

## 2020-08-01 DIAGNOSIS — E559 Vitamin D deficiency, unspecified: Secondary | ICD-10-CM

## 2020-08-01 NOTE — Telephone Encounter (Signed)
Patient called and said that when she was seen on 07/19/2020 - Richard said that he was sending in vitamin D supplement and something for inflammation - patient can not recall the name of the medication - this rx for inflammation was never sent in - Patient would like the rx for inflammation sent to [facility]

## 2020-08-02 MED ORDER — MELOXICAM 7.5 MG PO TABS: 7.5000 mg | ORAL_TABLET | Freq: Every day | ORAL | 0 refills | Status: DC

## 2020-08-02 NOTE — Telephone Encounter (Signed)
Patient called again concerning her prescription - I told her that Delfino Lovett will not be in until Tuesday - and patient wanted to know if someone else could send in the rx

## 2020-08-02 NOTE — Telephone Encounter (Signed)
Prescription was filled in Provider's absence

## 2020-09-13 ENCOUNTER — Ambulatory Visit: Payer: Medicare Other | Admitting: Podiatry

## 2020-10-02 ENCOUNTER — Other Ambulatory Visit: Payer: Self-pay

## 2020-10-02 ENCOUNTER — Ambulatory Visit (INDEPENDENT_AMBULATORY_CARE_PROVIDER_SITE_OTHER): Payer: Medicare Other | Admitting: Podiatry

## 2020-10-02 DIAGNOSIS — M79674 Pain in right toe(s): Secondary | ICD-10-CM | POA: Diagnosis not present

## 2020-10-02 DIAGNOSIS — B351 Tinea unguium: Secondary | ICD-10-CM

## 2020-10-02 DIAGNOSIS — M79675 Pain in left toe(s): Secondary | ICD-10-CM

## 2020-10-03 ENCOUNTER — Encounter: Payer: Self-pay | Admitting: Podiatry

## 2020-10-03 NOTE — Progress Notes (Signed)
  Subjective:  Patient ID: Sheri Griffith, female    DOB: December 08, 1950,  MRN: [id number]  Chief Complaint  Patient presents with   Nail Problem    Nail trim    70 y.o. female returns for the above complaint.  Patient presents with thickened elongated dystrophic toenails x10.  Painful to touch.  She would like to have them debrided out as she is not able to do it herself.  She is not a diabetic.  She denies any other acute complaints.  Objective:  There were no vitals filed for this visit. Podiatric Exam: Vascular: dorsalis pedis and posterior tibial pulses are palpable bilateral. Capillary return is immediate. Temperature gradient is WNL. Skin turgor WNL  Sensorium: Normal Semmes Weinstein monofilament test. Normal tactile sensation bilaterally. Nail Exam: Pt has thick disfigured discolored nails with subungual debris noted bilateral entire nail hallux through fifth toenails.  Pain on palpation to the nails. Ulcer Exam: There is no evidence of ulcer or pre-ulcerative changes or infection. Orthopedic Exam: Muscle tone and strength are WNL. No limitations in general ROM. No crepitus or effusions noted. HAV  B/L.  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 Skin: No Porokeratosis. No infection or ulcers    Assessment & Plan:   1. Pain due to onychomycosis of toenails of both feet      Patient was evaluated and treated and all questions answered.  Onychomycosis with pain  -Nails palliatively debrided as below. -Educated on self-care  Procedure: Nail Debridement Rationale: pain  Type of Debridement: manual, sharp debridement. Instrumentation: Nail nipper, rotary burr. Number of Nails: 10  Procedures and Treatment: Consent by patient was obtained for treatment procedures. The patient understood the discussion of treatment and procedures well. All questions were answered thoroughly reviewed. Debridement of mycotic and hypertrophic toenails, 1 through 5 bilateral and clearing of subungual debris. No ulceration,  no infection noted.  Return Visit-Office Procedure: Patient instructed to return to the office for a follow up visit 3 months for continued evaluation and treatment.  Boneta Lucks, DPM    No follow-ups on file.

## 2020-10-22 ENCOUNTER — Ambulatory Visit (INDEPENDENT_AMBULATORY_CARE_PROVIDER_SITE_OTHER): Payer: Medicare Other | Admitting: Registered Nurse

## 2020-10-22 ENCOUNTER — Other Ambulatory Visit: Payer: Self-pay

## 2020-10-22 ENCOUNTER — Encounter: Payer: Self-pay | Admitting: Registered Nurse

## 2020-10-22 VITALS — Temp 98.2°F | Resp 18 | Ht 64.0 in | Wt 193.2 lb

## 2020-10-22 DIAGNOSIS — R1084 Generalized abdominal pain: Secondary | ICD-10-CM

## 2020-10-22 DIAGNOSIS — E559 Vitamin D deficiency, unspecified: Secondary | ICD-10-CM | POA: Diagnosis not present

## 2020-10-22 DIAGNOSIS — D72829 Elevated white blood cell count, unspecified: Secondary | ICD-10-CM

## 2020-10-22 DIAGNOSIS — K219 Gastro-esophageal reflux disease without esophagitis: Secondary | ICD-10-CM | POA: Diagnosis not present

## 2020-10-22 MED ORDER — PANTOPRAZOLE SODIUM 40 MG PO TBEC: 40.0000 mg | DELAYED_RELEASE_TABLET | Freq: Every day | ORAL | 3 refills | Status: DC

## 2020-10-22 NOTE — Patient Instructions (Addendum)
Ms. Lutzke -    Doristine Devoid to see you  Labs will be back tomorrow. I'll call with any concerns.  Let me know if any new symptoms arise.  Try pantoprazole for at least 14 days unless intolerable  Try to avoid anything to eat or drink for an hour or two before bed  Take 1000mg  tylenol about 2 hours before your next orthodontist appointment.  Stretch for 20-30 minutes daily. This will help with pain, height, and pretty much everything!   Thank you  Rich    If you have lab work done today you will be contacted with your lab results within the next 2 weeks.  If you have not heard from [place] then please contact us. The fastest way to get your results is to register for My Chart.   IF you received an x-ray today, you will receive an invoice from [facility]. Please contact [facility] at [phone] with questions or concerns regarding your invoice.   IF you received labwork today, you will receive an invoice from [facility]. Please contact [facility] at [phone] with questions or concerns regarding your invoice.   Our billing staff will not be able to assist you with questions regarding bills from these companies.  You will be contacted with the lab results as soon as they are available. The fastest way to get your results is to activate your My Chart account. Instructions are located on the last page of this paperwork. If you have not heard from [place] regarding the results in 2 weeks, please contact this office.

## 2020-10-22 NOTE — Progress Notes (Signed)
Established Patient Office Visit  Subjective:  Patient ID: Sheri Griffith, female    DOB: 07-Feb-1950  Age: 70 y.o. MRN: [id number]  CC:  Chief Complaint  Patient presents with   Follow-up    Patient states she is here for a 3 month follow up and to discuss labs and some stomach pain about 2 weeks ago but felt it again.    HPI Sheri Griffith presents for repeat labs and stomach pain  Labs: Vitamin d, noted to be low to 18. Supplemented. Feeling better.  Wants to recheck  Stomach pain Noted after visit to orthodontist after lying down Middle of abdomen, radiating towards sides Noted more after drinking wine - unsure if sulfates   Past Medical History:  Diagnosis Date   Arthritis of knee    Hypertension    Sinus problem     Past Surgical History:  Procedure Laterality Date   NONE      Family History  Problem Relation Age of Onset   Other Mother        staph infection   Other Father        died age 30   Colon cancer Neg Hx    Rectal cancer Neg Hx    Esophageal cancer Neg Hx    Liver cancer Neg Hx     Social History   Socioeconomic History   Marital status: Married    Spouse name: Not on file   Number of children: 2   Years of education: Not on file   Highest education level: Not on file  Occupational History   Occupation: Oceanographer  Tobacco Use   Smoking status: Never   Smokeless tobacco: Never  Substance and Sexual Activity   Alcohol use: Yes    Comment: occasional use   Drug use: No   Sexual activity: Yes    Birth control/protection: None  Other Topics Concern   Not on file  Social History Narrative   Not on file   Social Determinants of Health   Financial Resource Strain: Not on file  Food Insecurity: Not on file  Transportation Needs: Not on file  Physical Activity: Not on file  Stress: Not on file  Social Connections: Not on file  Intimate Partner Violence: Not on file    Outpatient Medications Prior to Visit  Medication Sig  Dispense Refill   azelastine (ASTELIN) 0.1 % nasal spray Place 1 spray into both nostrils 2 (two) times daily. Use in each nostril as directed 30 mL 12   cyclobenzaprine (FLEXERIL) 5 MG tablet Take 1 tablet (5 mg total) by mouth 3 (three) times daily as needed for muscle spasms. 30 tablet 1   gabapentin (NEURONTIN) 100 MG capsule Take 1 capsule (100 mg total) by mouth 3 (three) times daily. 90 capsule 3   meloxicam (MOBIC) 7.5 MG tablet Take 1 tablet (7.5 mg total) by mouth daily. 30 tablet 0   montelukast (SINGULAIR) 10 MG tablet Take 1 tablet (10 mg total) by mouth at bedtime. 30 tablet 3   Vitamin D, Ergocalciferol, (DRISDOL) 1.25 MG (50000 UNIT) CAPS capsule Take 1 capsule (50,000 Units total) by mouth every 7 (seven) days. 12 capsule 0   No facility-administered medications prior to visit.    Allergies  Allergen Reactions   Procaine Hcl    Cortisone Hives   Lidocaine Rash    ROS Review of Systems    Objective:    Physical Exam Vitals and nursing note reviewed.  Constitutional:  General: She is not in acute distress.    Appearance: Normal appearance. She is normal weight. She is not ill-appearing, toxic-appearing or diaphoretic.  Cardiovascular:     Rate and Rhythm: Normal rate and regular rhythm.     Heart sounds: Normal heart sounds. No murmur heard.   No friction rub. No gallop.  Pulmonary:     Effort: Pulmonary effort is normal. No respiratory distress.     Breath sounds: Normal breath sounds. No stridor. No wheezing, rhonchi or rales.  Chest:     Chest wall: No tenderness.  Abdominal:     General: Abdomen is flat. Bowel sounds are normal. There is no distension.     Palpations: Abdomen is soft. There is no mass.     Tenderness: There is no abdominal tenderness. There is no right CVA tenderness, left CVA tenderness, guarding or rebound.     Hernia: No hernia is present.  Skin:    General: Skin is warm and dry.  Neurological:     General: No focal deficit  present.     Mental Status: She is alert and oriented to person, place, and time. Mental status is at baseline.  Psychiatric:        Mood and Affect: Mood normal.        Behavior: Behavior normal.        Thought Content: Thought content normal.        Judgment: Judgment normal.    Temp 98.2 F (36.8 C) (Temporal)   Resp 18   Ht 5\' 4"  (1.626 m)   Wt 193 lb 3.2 oz (87.6 kg)   SpO2 98%   BMI 33.16 kg/m  Wt Readings from Last 3 Encounters:  10/22/20 193 lb 3.2 oz (87.6 kg)  07/19/20 194 lb 8 oz (88.2 kg)  05/16/19 197 lb (89.4 kg)     There are no preventive care reminders to display for this patient.  There are no preventive care reminders to display for this patient.  Lab Results  Component Value Date   TSH 2.19 07/19/2020   Lab Results  Component Value Date   WBC 3.3 (L) 10/22/2020   HGB 13.5 10/22/2020   HCT 40.7 10/22/2020   MCV 88.1 10/22/2020   PLT 171.0 10/22/2020   Lab Results  Component Value Date   NA 140 07/19/2020   K 4.3 07/19/2020   CO2 27 07/19/2020   GLUCOSE 87 07/19/2020   BUN 11 07/19/2020   CREATININE 0.90 07/19/2020   BILITOT 0.8 07/19/2020   ALKPHOS 60 07/19/2020   AST 15 07/19/2020   ALT 14 07/19/2020   PROT 6.9 07/19/2020   ALBUMIN 4.5 07/19/2020   CALCIUM 9.7 07/19/2020   GFR 65.33 07/19/2020   Lab Results  Component Value Date   CHOL 210 (H) 07/19/2020   Lab Results  Component Value Date   HDL 71.70 07/19/2020   Lab Results  Component Value Date   LDLCALC 122 (H) 07/19/2020   Lab Results  Component Value Date   TRIG 81.0 07/19/2020   Lab Results  Component Value Date   CHOLHDL 3 07/19/2020   Lab Results  Component Value Date   HGBA1C 6.0 07/19/2020      Assessment & Plan:   Problem List Items Addressed This Visit       Digestive   GERD - Primary   Relevant Medications   pantoprazole (PROTONIX) 40 MG tablet   Other Relevant Orders   Pathologist smear review (Completed)   Vitamin D (  25 hydroxy)  (Completed)   CBC with Differential/Platelet (Completed)     Other   Generalized abdominal pain   Relevant Orders   CT Abdomen Pelvis W Contrast   Other Visit Diagnoses     Leukocytosis, unspecified type       Relevant Orders   Pathologist smear review (Completed)   Vitamin D (25 hydroxy) (Completed)   CBC with Differential/Platelet (Completed)   CT Abdomen Pelvis W Contrast   Vitamin D deficiency       Relevant Orders   Vitamin D (25 hydroxy) (Completed)   CBC with Differential/Platelet (Completed)       Meds ordered this encounter  Medications   pantoprazole (PROTONIX) 40 MG tablet    Sig: Take 1 tablet (40 mg total) by mouth daily.    Dispense:  30 tablet    Refill:  3    Order Specific Question:   Supervising Provider    Answer:   Carlota Raspberry, JEFFREY R [[id number]]    Follow-up: No follow-ups on file.   PLAN Pursue CT scan based on nonspecific abdominal pain and hx of abnormal WBC Will repeat labs and follow up as warranted Can start pantoprazole in case GERD is factor - will monitor effect.  Patient encouraged to call clinic with any questions, comments, or concerns.  Maximiano Coss, NP

## 2020-10-23 LAB — CBC WITH DIFFERENTIAL/PLATELET
Basophils Absolute: 0 10*3/uL (ref 0.0–0.1)
Basophils Relative: 0.8 % (ref 0.0–3.0)
Eosinophils Absolute: 0.2 10*3/uL (ref 0.0–0.7)
Eosinophils Relative: 4.6 % (ref 0.0–5.0)
HCT: 40.7 % (ref 36.0–46.0)
Hemoglobin: 13.5 g/dL (ref 12.0–15.0)
Lymphocytes Relative: 65.9 % — ABNORMAL HIGH (ref 12.0–46.0)
Lymphs Abs: 2.1 10*3/uL (ref 0.7–4.0)
MCHC: 33.2 g/dL (ref 30.0–36.0)
MCV: 88.1 fl (ref 78.0–100.0)
Monocytes Absolute: 0.3 10*3/uL (ref 0.1–1.0)
Monocytes Relative: 10.6 % (ref 3.0–12.0)
Neutro Abs: 0.6 10*3/uL — ABNORMAL LOW (ref 1.4–7.7)
Neutrophils Relative %: 18.1 % — ABNORMAL LOW (ref 43.0–77.0)
Platelets: 171 10*3/uL (ref 150.0–400.0)
RBC: 4.62 Mil/uL (ref 3.87–5.11)
RDW: 13 % (ref 11.5–15.5)
WBC: 3.3 10*3/uL — ABNORMAL LOW (ref 4.0–10.5)

## 2020-10-23 LAB — VITAMIN D 25 HYDROXY (VIT D DEFICIENCY, FRACTURES): VITD: 22.05 ng/mL — ABNORMAL LOW (ref 30.00–100.00)

## 2020-10-23 LAB — PATHOLOGIST SMEAR REVIEW

## 2020-10-28 ENCOUNTER — Other Ambulatory Visit: Payer: Self-pay | Admitting: Registered Nurse

## 2020-10-28 DIAGNOSIS — R7989 Other specified abnormal findings of blood chemistry: Secondary | ICD-10-CM

## 2020-10-28 NOTE — Progress Notes (Signed)
Can call patient - Blood counts are still off. I want to refer her to a hematologist for further assessment.  Thanks,  Denice Paradise

## 2020-10-30 ENCOUNTER — Telehealth: Payer: Self-pay

## 2020-10-30 ENCOUNTER — Other Ambulatory Visit: Payer: Self-pay

## 2020-10-30 DIAGNOSIS — D72819 Decreased white blood cell count, unspecified: Secondary | ICD-10-CM

## 2020-10-30 NOTE — Telephone Encounter (Signed)
Patients presents to the office today because when we spoke about her lab results the call dropped and I could not get back through to patient. I reiterated the results to her. Pt understood and I informed her that I had mailed her results to her but also printed them out again for her just in case something happened to the mail delivery. No further concerns at this time.

## 2020-10-31 ENCOUNTER — Other Ambulatory Visit: Payer: Self-pay

## 2020-10-31 ENCOUNTER — Other Ambulatory Visit (INDEPENDENT_AMBULATORY_CARE_PROVIDER_SITE_OTHER): Payer: Medicare Other

## 2020-10-31 ENCOUNTER — Other Ambulatory Visit: Payer: Medicare Other

## 2020-10-31 DIAGNOSIS — D72819 Decreased white blood cell count, unspecified: Secondary | ICD-10-CM | POA: Diagnosis not present

## 2020-11-01 ENCOUNTER — Other Ambulatory Visit: Payer: Medicare Other

## 2020-11-01 LAB — BASIC METABOLIC PANEL
BUN: 18 mg/dL (ref 6–23)
CO2: 27 mEq/L (ref 19–32)
Calcium: 9.9 mg/dL (ref 8.4–10.5)
Chloride: 102 mEq/L (ref 96–112)
Creatinine, Ser: 1.05 mg/dL (ref 0.40–1.20)
GFR: 54.19 mL/min — ABNORMAL LOW (ref >60.00)
Glucose, Bld: 90 mg/dL (ref 70–99)
Potassium: 4.1 mEq/L (ref 3.5–5.1)
Sodium: 138 mEq/L (ref 135–145)

## 2020-11-04 ENCOUNTER — Telehealth: Payer: Self-pay | Admitting: Hematology and Oncology

## 2020-11-04 NOTE — Telephone Encounter (Signed)
Scheduled appt per 9/26 referral. Pt is aware of appt date and time. Pt is aware to arrive 15 mins early to appt.

## 2020-11-05 NOTE — Progress Notes (Addendum)
[place] NOTE  Patient Care Team: Maximiano Coss, NP as PCP - General (Adult Health Nurse Practitioner)  CHIEF COMPLAINTS/PURPOSE OF CONSULTATION:  Newly diagnosed abnormal CBC  HISTORY OF PRESENTING ILLNESS:  Sheri Griffith 70 y.o. female is here because of recent diagnosis of abnormal CBC. Labs on 10/22/2020 showed neutrophils relative 18.1%, lymphocytes relative 65.9%, neutro abs 0.6. She presents to the clinic today for initial evaluation and discussion of treatment options.  On review of her records, it appears that she has had low blood counts going on for at least the last 13 years if not longer.  She has had numbers that have ranged from an [place] of 0.6 most recently to 1.5.  She has not noticed any frequent infections or illnesses.  She does not have any signs or symptoms of lupus.  She eats fairly well and does not appear to have any signs or symptoms of B12 deficiency.  She is slightly obese but is working on exercising very frequently.  I reviewed her records extensively and collaborated the history with the patient.   MEDICAL HISTORY:  Past Medical History:  Diagnosis Date   Arthritis of knee    Hypertension    Sinus problem     SURGICAL HISTORY: Past Surgical History:  Procedure Laterality Date   NONE      SOCIAL HISTORY: Social History   Socioeconomic History   Marital status: Married    Spouse name: Not on file   Number of children: 2   Years of education: Not on file   Highest education level: Not on file  Occupational History   Occupation: Oceanographer  Tobacco Use   Smoking status: Never   Smokeless tobacco: Never  Substance and Sexual Activity   Alcohol use: Yes    Comment: occasional use   Drug use: No   Sexual activity: Yes    Birth control/protection: None  Other Topics Concern   Not on file  Social History Narrative   Not on file   Social Determinants of Health   Financial Resource Strain: Not on file   Food Insecurity: Not on file  Transportation Needs: Not on file  Physical Activity: Not on file  Stress: Not on file  Social Connections: Not on file  Intimate Partner Violence: Not on file    FAMILY HISTORY: Family History  Problem Relation Age of Onset   Other Mother        staph infection   Other Father        died age 78   Colon cancer Neg Hx    Rectal cancer Neg Hx    Esophageal cancer Neg Hx    Liver cancer Neg Hx     ALLERGIES:  is allergic to procaine hcl, cortisone, and lidocaine.  MEDICATIONS:  Current Outpatient Medications  Medication Sig Dispense Refill   azelastine (ASTELIN) 0.1 % nasal spray Place 1 spray into both nostrils 2 (two) times daily. Use in each nostril as directed 30 mL 12   cyclobenzaprine (FLEXERIL) 5 MG tablet Take 1 tablet (5 mg total) by mouth 3 (three) times daily as needed for muscle spasms. 30 tablet 1   gabapentin (NEURONTIN) 100 MG capsule Take 1 capsule (100 mg total) by mouth 3 (three) times daily. 90 capsule 3   meloxicam (MOBIC) 7.5 MG tablet Take 1 tablet (7.5 mg total) by mouth daily. 30 tablet 0   montelukast (SINGULAIR) 10 MG tablet Take 1 tablet (10 mg total) by mouth  at bedtime. 30 tablet 3   pantoprazole (PROTONIX) 40 MG tablet Take 1 tablet (40 mg total) by mouth daily. 30 tablet 3   Vitamin D, Ergocalciferol, (DRISDOL) 1.25 MG (50000 UNIT) CAPS capsule Take 1 capsule (50,000 Units total) by mouth every 7 (seven) days. 12 capsule 0   No current facility-administered medications for this visit.    REVIEW OF SYSTEMS:   Constitutional: Denies fevers, chills or abnormal night sweats Eyes: Denies blurriness of vision, double vision or watery eyes Ears, nose, mouth, throat, and face: Denies mucositis or sore throat Respiratory: Denies cough, dyspnea or wheezes Cardiovascular: Denies palpitation, chest discomfort or lower extremity swelling Gastrointestinal:  Denies nausea, heartburn or change in bowel habits Skin: Denies  abnormal skin rashes Lymphatics: Denies new lymphadenopathy or easy bruising Neurological:Denies numbness, tingling or new weaknesses Behavioral/Psych: Mood is stable, no new changes  All other systems were reviewed with the patient and are negative.  PHYSICAL EXAMINATION: ECOG PERFORMANCE STATUS: 1 - Symptomatic but completely ambulatory  Vitals:   11/06/20 1604  BP: (!) 182/77  Pulse: 87  Resp: 18  Temp: (!) 97.4 F (36.3 C)  SpO2: 100%   Filed Weights   11/06/20 1604  Weight: 194 lb 14.4 oz (88.4 kg)      LABORATORY DATA:  I have reviewed the data as listed Lab Results  Component Value Date   WBC 3.3 (L) 10/22/2020   HGB 13.5 10/22/2020   HCT 40.7 10/22/2020   MCV 88.1 10/22/2020   PLT 171.0 10/22/2020   Lab Results  Component Value Date   NA 138 10/31/2020   K 4.1 10/31/2020   CL 102 10/31/2020   CO2 27 10/31/2020    RADIOGRAPHIC STUDIES: I have personally reviewed the radiological reports and agreed with the findings in the report.  ASSESSMENT AND PLAN:  Neutropenia (HCC) Chronic neutropenia: Lab review: 05/31/2007: WBC 3.8, hemoglobin 13.9, platelets 198, ANC 0.8 10/22/2020: WBC 3.3, hemoglobin 13.5, platelets of 171, ANC 0.6  Differential diagnosis: 1. Infections: Especially viruses like HIV 2. inflammation/autoimmune causes including lupus/rheumatoid arthritis 3. Nutritional causes but Y-77 or folic acid deficiency 4. Medication induced 5. Bone marrow disorders  Workup Recommended: 1. CBC with differential with smear 2. A-12 and folic acid levels 3. ANA 4. CML  After much discussion, we decided to hold off on doing any additional work up since she has had this issue going on for a very long time and she has not had any frequent infections I believe this may be ethnicity related and can be watched and monitored. Please send the patient back if her ANC drops below 0.5 consistently or if other cell lines get effected.  RTC on an as needed basis      All questions were answered. The patient knows to call the clinic with any problems, questions or concerns.   Rulon Eisenmenger, MD, MPH 11/06/2020    I, Thana Ates, am acting as scribe for Nicholas Lose, MD.  I have reviewed the above documentation for accuracy and completeness, and I agree with the above.

## 2020-11-06 ENCOUNTER — Other Ambulatory Visit: Payer: Self-pay

## 2020-11-06 ENCOUNTER — Inpatient Hospital Stay: Payer: Medicare Other | Attending: Hematology and Oncology | Admitting: Hematology and Oncology

## 2020-11-06 DIAGNOSIS — Z79899 Other long term (current) drug therapy: Secondary | ICD-10-CM | POA: Insufficient documentation

## 2020-11-06 DIAGNOSIS — D709 Neutropenia, unspecified: Secondary | ICD-10-CM | POA: Diagnosis not present

## 2020-11-06 DIAGNOSIS — Z800 Family history of malignant neoplasm of digestive organs: Secondary | ICD-10-CM | POA: Insufficient documentation

## 2020-11-06 DIAGNOSIS — I10 Essential (primary) hypertension: Secondary | ICD-10-CM | POA: Diagnosis not present

## 2020-11-06 DIAGNOSIS — E669 Obesity, unspecified: Secondary | ICD-10-CM | POA: Diagnosis not present

## 2020-11-06 NOTE — Assessment & Plan Note (Signed)
Chronic neutropenia: Lab review: 05/31/2007: WBC 3.8, hemoglobin 13.9, platelets 198, ANC 0.8 10/22/2020: WBC 3.3, hemoglobin 13.5, platelets of 171, ANC 0.6  Differential diagnosis: 1. Infections: Especially viruses like HIV 2. inflammation/autoimmune causes including lupus/rheumatoid arthritis 3. Nutritional causes but X-41 or folic acid deficiency 4. Medication induced 5. Bone marrow disorders  Workup today: 1. CBC with differential with smear 2. P-97 and folic acid levels 3. ANA   Return to clinic in 2 weeks to discuss the results and consider bone marrow biopsy if necessary I will call the patient with the results of the blood work.

## 2020-11-15 ENCOUNTER — Other Ambulatory Visit: Payer: Self-pay | Admitting: Registered Nurse

## 2020-11-15 DIAGNOSIS — E559 Vitamin D deficiency, unspecified: Secondary | ICD-10-CM

## 2021-01-01 ENCOUNTER — Ambulatory Visit: Payer: Medicare Other | Admitting: Podiatry

## 2021-01-22 ENCOUNTER — Other Ambulatory Visit: Payer: Self-pay

## 2021-01-22 ENCOUNTER — Other Ambulatory Visit: Payer: Self-pay | Admitting: Podiatry

## 2021-01-22 ENCOUNTER — Ambulatory Visit (INDEPENDENT_AMBULATORY_CARE_PROVIDER_SITE_OTHER): Payer: Medicare Other | Admitting: Podiatry

## 2021-01-22 DIAGNOSIS — M79675 Pain in left toe(s): Secondary | ICD-10-CM

## 2021-01-22 DIAGNOSIS — B351 Tinea unguium: Secondary | ICD-10-CM | POA: Diagnosis not present

## 2021-01-22 MED ORDER — CICLOPIROX 8 % EX SOLN: Freq: Every day | CUTANEOUS | 0 refills | Status: DC

## 2021-01-23 LAB — HM MAMMOGRAPHY

## 2021-01-24 NOTE — Progress Notes (Signed)
°  Subjective:  Patient ID: Sheri Griffith, female    DOB: 05-16-1950,  MRN: [id number]  Chief Complaint  Patient presents with   Nail Problem    Patient states she feels like the nail fungus is getting worse and spreading to the other toes.    70 y.o. female returns for the above complaint.  Patient presents with thickened elongated dystrophic toenails x10.  Painful to touch.  She would like to discuss treatment options for nail fungus.  She has not tried anything for it.  She wants to discuss options again.  Since the last visit the nails have gotten worse.  She denies any other acute complaints.  Objective:  There were no vitals filed for this visit. Podiatric Exam: Vascular: dorsalis pedis and posterior tibial pulses are palpable bilateral. Capillary return is immediate. Temperature gradient is WNL. Skin turgor WNL  Sensorium: Normal Semmes Weinstein monofilament test. Normal tactile sensation bilaterally. Nail Exam: Pt has thick disfigured discolored nails with subungual debris noted bilateral entire nail hallux through fifth toenails.  Pain on palpation to the nails. Ulcer Exam: There is no evidence of ulcer or pre-ulcerative changes or infection. Orthopedic Exam: Muscle tone and strength are WNL. No limitations in general ROM. No crepitus or effusions noted. HAV  B/L.  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 Skin: No Porokeratosis. No infection or ulcers    Assessment & Plan:   1. Pain due to onychomycosis of toenails of both feet      Patient was evaluated and treated and all questions answered.  Onychomycosis with pain x10 -Educated the patient on the etiology of onychomycosis and various treatment options associated with improving the fungal load.  I explained to the patient that there is 3 treatment options available to treat the onychomycosis including topical, p.o., laser treatment.  Patient has elected to go topical application with Penlac.  I instructed her to apply twice a day for 6 to 8  months to see results.  She states understanding and will do so.   Boneta Lucks, DPM    No follow-ups on file.

## 2021-01-31 NOTE — Telephone Encounter (Signed)
Patient called, states that the Penlac is $46.00 with her insurance, and she cannot afford it. She is asking for cheaper alternatives. I suggested tea tree oil might be a less expensive or maybe formula 7. Advised that I would check with you and call her back. Please advise Thanks

## 2021-04-10 ENCOUNTER — Other Ambulatory Visit: Payer: Self-pay | Admitting: Registered Nurse

## 2021-04-10 DIAGNOSIS — E559 Vitamin D deficiency, unspecified: Secondary | ICD-10-CM

## 2021-04-23 ENCOUNTER — Other Ambulatory Visit: Payer: Self-pay

## 2021-04-23 ENCOUNTER — Ambulatory Visit (INDEPENDENT_AMBULATORY_CARE_PROVIDER_SITE_OTHER): Payer: Medicare Other | Admitting: Podiatry

## 2021-04-23 DIAGNOSIS — B351 Tinea unguium: Secondary | ICD-10-CM

## 2021-04-25 NOTE — Progress Notes (Signed)
?  Subjective:  ?Patient ID: Sheri Griffith, female    DOB: Aug 05, 1950,  MRN: [id number] ? ?Chief Complaint  ?Patient presents with  ? Nail Problem  ?  Nail fungus  ? ?71 y.o. female returns for the above complaint.  Patient presents with thickened elongated dystrophic toenails x10.  Painful to touch.  She states that the Penlac helps a little bit but she would like to get another refill on it.  She denies any other acute complaints ? ?Objective:  ?There were no vitals filed for this visit. ?Podiatric Exam: ?Vascular: dorsalis pedis and posterior tibial pulses are palpable bilateral. Capillary return is immediate. Temperature gradient is WNL. Skin turgor WNL  ?Sensorium: Normal Semmes Weinstein monofilament test. Normal tactile sensation bilaterally. ?Nail Exam: Pt has thick disfigured discolored nails with subungual debris noted bilateral entire nail hallux through fifth toenails.  Pain on palpation to the nails. ?Ulcer Exam: There is no evidence of ulcer or pre-ulcerative changes or infection. ?Orthopedic Exam: Muscle tone and strength are WNL. No limitations in general ROM. No crepitus or effusions noted. HAV  B/L.  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 ?Skin: No Porokeratosis. No infection or ulcers ? ? ? ?Assessment & Plan:  ? ?No diagnosis found. ? ? ? ?Patient was evaluated and treated and all questions answered. ? ?Onychomycosis with pain x10~very slowly improving ?-Educated the patient on the etiology of onychomycosis and various treatment options associated with improving the fungal load.  I explained to the patient that there is 3 treatment options available to treat the onychomycosis including topical, p.o., laser treatment.  Patient has elected to continue doing topical application with Penlac.  I instructed her to apply twice a day for 6 to 8 months to see results.  She states understanding and will do so. ?-Penlac was resent to the pharmacy. ? ?Boneta Lucks, DPM ?  ? ?No follow-ups on file. ? ?

## 2021-08-26 ENCOUNTER — Ambulatory Visit (INDEPENDENT_AMBULATORY_CARE_PROVIDER_SITE_OTHER): Payer: Medicare Other

## 2021-08-26 DIAGNOSIS — Z1211 Encounter for screening for malignant neoplasm of colon: Secondary | ICD-10-CM

## 2021-08-26 DIAGNOSIS — Z0000 Encounter for general adult medical examination without abnormal findings: Secondary | ICD-10-CM

## 2021-08-26 NOTE — Progress Notes (Signed)
Subjective:   Sheri Griffith is a 71 y.o. female who presents for Medicare Annual (Subsequent) preventive examination.   I connected with [place]  today by telephone and verified that I am speaking with the correct person using two identifiers. Location patient: home Location provider: work Persons participating in the virtual visit: patient, provider.   I discussed the limitations, risks, security and privacy concerns of performing an evaluation and management service by telephone and the availability of in person appointments. I also discussed with the patient that there may be a patient responsible charge related to this service. The patient expressed understanding and verbally consented to this telephonic visit.    Interactive audio and video telecommunications were attempted between this provider and patient, however failed, due to patient having technical difficulties OR patient did not have access to video capability.  We continued and completed visit with audio only.    Review of Systems     Cardiac Risk Factors include: advanced age (>25mn, >>23women)     Objective:    Today's Vitals   There is no height or weight on file to calculate BMI.     08/26/2021   10:24 AM 05/09/2019   11:09 AM  Advanced Directives  Does Patient Have a Medical Advance Directive? Yes No  Type of AParamedicof APooleLiving will   Copy of [place] Chart? No - copy requested   Would patient like information on creating a medical advance directive?  No - Patient declined    Current Medications (verified) Outpatient Encounter Medications as of 08/26/2021  Medication Sig   Vitamin D, Ergocalciferol, (DRISDOL) 1.25 MG (50000 UNIT) CAPS capsule TAKE 1 CAPSULE (50,000 UNITS TOTAL) BY MOUTH EVERY 7 (SEVEN) DAYS   azelastine (ASTELIN) 0.1 % nasal spray Place 1 spray into both nostrils 2 (two) times daily. Use in each nostril as directed (Patient not  taking: Reported on 08/26/2021)   ciclopirox (PENLAC) 8 % solution APPLY TOPICALLY AT BEDTIME. APPLY OVER NAIL AND SURROUNDING SKIN. APPLY DAILY OVER PREVIOUS COAT. AFTER SEVEN (7) DAYS, MAY REMOVE WITH ALCOHOL AND CONTINUE CYCLE. (Patient not taking: Reported on 08/26/2021)   cyclobenzaprine (FLEXERIL) 5 MG tablet Take 1 tablet (5 mg total) by mouth 3 (three) times daily as needed for muscle spasms. (Patient not taking: Reported on 08/26/2021)   gabapentin (NEURONTIN) 100 MG capsule Take 1 capsule (100 mg total) by mouth 3 (three) times daily. (Patient not taking: Reported on 08/26/2021)   meloxicam (MOBIC) 7.5 MG tablet Take 1 tablet (7.5 mg total) by mouth daily. (Patient not taking: Reported on 08/26/2021)   montelukast (SINGULAIR) 10 MG tablet Take 1 tablet (10 mg total) by mouth at bedtime. (Patient not taking: Reported on 08/26/2021)   pantoprazole (PROTONIX) 40 MG tablet Take 1 tablet (40 mg total) by mouth daily. (Patient not taking: Reported on 08/26/2021)   No facility-administered encounter medications on file as of 08/26/2021.    Allergies (verified) Procaine hcl, Cortisone, and Lidocaine   History: Past Medical History:  Diagnosis Date   Arthritis of knee    Hypertension    Sinus problem    Past Surgical History:  Procedure Laterality Date   NONE     Family History  Problem Relation Age of Onset   Other Mother        staph infection   Other Father        died age 70  Colon cancer Neg Hx  Rectal cancer Neg Hx    Esophageal cancer Neg Hx    Liver cancer Neg Hx    Social History   Socioeconomic History   Marital status: Married    Spouse name: Not on file   Number of children: 2   Years of education: Not on file   Highest education level: Not on file  Occupational History   Occupation: Oceanographer  Tobacco Use   Smoking status: Never   Smokeless tobacco: Never  Substance and Sexual Activity   Alcohol use: Yes    Comment: occasional use   Drug use: No    Sexual activity: Yes    Birth control/protection: None  Other Topics Concern   Not on file  Social History Narrative   Not on file   Social Determinants of Health   Financial Resource Strain: Low Risk  (08/26/2021)   Overall Financial Resource Strain (CARDIA)    Difficulty of Paying Living Expenses: Not hard at all  Food Insecurity: No Food Insecurity (08/26/2021)   Hunger Vital Sign    Worried About Running Out of Food in the Last Year: Never true    Gays in the Last Year: Never true  Transportation Needs: No Transportation Needs (08/26/2021)   PRAPARE - Hydrologist (Medical): No    Lack of Transportation (Non-Medical): No  Physical Activity: Sufficiently Active (08/26/2021)   Exercise Vital Sign    Days of Exercise per Week: 4 days    Minutes of Exercise per Session: 60 min  Stress: No Stress Concern Present (08/26/2021)   [place]    Feeling of Stress : Not at all  Social Connections: [place] (08/26/2021)   Social Connection and Isolation Panel [NHANES]    Frequency of Communication with Friends and Family: Three times a week    Frequency of Social Gatherings with Friends and Family: Three times a week    Attends Religious Services: 1 to 4 times per year    Active Member of Clubs or Organizations: Yes    Attends Archivist Meetings: 1 to 4 times per year    Marital Status: Married    Tobacco Counseling Counseling given: Not Answered   Clinical Intake:  Pre-visit preparation completed: Yes  Pain : No/denies pain     Nutritional Risks: None Diabetes: No  How often do you need to have someone help you when you read instructions, pamphlets, or other written materials from your doctor or pharmacy?: 1 - Never What is the last grade level you completed in school?: college  Diabetic?no  Interpreter Needed?: No  Information entered by ::  L.Wilson,LPN   Activities of Daily Living    08/26/2021   10:31 AM 10/22/2020    3:29 PM  In your present state of health, do you have any difficulty performing the following activities:  Hearing? 0 0  Vision? 0 0  Difficulty concentrating or making decisions? 0 0  Walking or climbing stairs? 0 0  Dressing or bathing? 0 0  Doing errands, shopping? 0 0  Preparing Food and eating ? N   Using the Toilet? N   In the past six months, have you accidently leaked urine? N   Do you have problems with loss of bowel control? N   Managing your Medications? N   Managing your Finances? N   Housekeeping or managing your Housekeeping? N     Patient Care Team:  Maximiano Coss, NP as PCP - General (Adult Health Nurse Practitioner)  Indicate any recent Medical Services you may have received from other than [facility] providers in the past year (date may be approximate).     Assessment:   This is a routine wellness examination for [facility].  Hearing/Vision screen Vision Screening - Comments:: Annual eye exams   Dietary issues and exercise activities discussed: Current Exercise Habits: Home exercise routine, Type of exercise: walking, Time (Minutes): 60, Frequency (Times/Week): 4, Weekly Exercise (Minutes/Week): 240, Intensity: Mild, Exercise limited by: None identified   Goals Addressed   None    Depression Screen    08/26/2021   10:25 AM 08/26/2021   10:22 AM 10/22/2020    3:27 PM 04/08/2020    8:48 AM 05/16/2019    2:41 PM 04/18/2019   11:00 AM 01/30/2017    9:48 AM  PHQ 2/9 Scores  PHQ - 2 Score 0 0 0 0 0 0 0  PHQ- 9 Score   0        Fall Risk    08/26/2021   10:25 AM 10/22/2020    3:27 PM 04/08/2020    8:48 AM 05/16/2019    2:41 PM 04/18/2019   11:00 AM  Fall Risk   Falls in the past year? 0 0 0 0 0  Number falls in past yr: 0 0 0 0 0  Injury with Fall? 0 0 0 0 0  Risk for fall due to :  No Fall Risks     Follow up Falls evaluation completed;Education provided Falls evaluation completed  Falls evaluation completed Falls evaluation completed Falls evaluation completed    [place]:  Any stairs in or around the home? Yes  If so, are there any without handrails? No  Home free of loose throw rugs in walkways, pet beds, electrical cords, etc? Yes  Adequate lighting in your home to reduce risk of falls? Yes   ASSISTIVE DEVICES UTILIZED TO PREVENT FALLS:  Life alert? No  Use of a cane, walker or w/c? No  Grab bars in the bathroom? Yes  Shower chair or bench in shower? No  Elevated toilet seat or a handicapped toilet? No   Cognitive Function:    Normal cognitive status assessed by telephone conversation by this Nurse Health Advisor. No abnormalities found.      Immunizations There is no immunization history for the selected administration types on file for this patient.  TDAP status: Due, Education has been provided regarding the importance of this vaccine. Advised may receive this vaccine at local pharmacy or Health Dept. Aware to provide a copy of the vaccination record if obtained from local pharmacy or Health Dept. Verbalized acceptance and understanding.  Flu Vaccine status: Due, Education has been provided regarding the importance of this vaccine. Advised may receive this vaccine at local pharmacy or Health Dept. Aware to provide a copy of the vaccination record if obtained from local pharmacy or Health Dept. Verbalized acceptance and understanding.  Pneumococcal vaccine status: Due, Education has been provided regarding the importance of this vaccine. Advised may receive this vaccine at local pharmacy or Health Dept. Aware to provide a copy of the vaccination record if obtained from local pharmacy or Health Dept. Verbalized acceptance and understanding.  Covid-19 vaccine status: Declined, Education has been provided regarding the importance of this vaccine but patient still declined. Advised may receive this vaccine at local pharmacy  or Health Dept.or vaccine clinic. Aware to provide a  copy of the vaccination record if obtained from local pharmacy or Health Dept. Verbalized acceptance and understanding.  Qualifies for Shingles Vaccine? Yes   Zostavax completed No   Shingrix Completed?: No.    Education has been provided regarding the importance of this vaccine. Patient has been advised to call insurance company to determine out of pocket expense if they have not yet received this vaccine. Advised may also receive vaccine at local pharmacy or Health Dept. Verbalized acceptance and understanding.  Screening Tests Health Maintenance  Topic Date Due   COVID-19 Vaccine (1) Never done   Hepatitis C Screening  Never done   Zoster Vaccines- Shingrix (1 of 2) Never done   COLONOSCOPY (Pts 45-56yr Insurance coverage will need to be confirmed)  02/08/2011   Pneumonia Vaccine 70 Years old (1 - PCV) Never done   MAMMOGRAM  12/25/2020   TETANUS/TDAP  10/22/2021 (Originally 04/06/1969)   INFLUENZA VACCINE  09/02/2021   DEXA SCAN  Completed   HPV VACCINES  Aged Out    Health Maintenance  Health Maintenance Due  Topic Date Due   COVID-19 Vaccine (1) Never done   Hepatitis C Screening  Never done   Zoster Vaccines- Shingrix (1 of 2) Never done   COLONOSCOPY (Pts 45-457yrInsurance coverage will need to be confirmed)  02/08/2011   Pneumonia Vaccine 6533Years old (1 - PCV) Never done   MAMMOGRAM  12/25/2020    Colorectal cancer screening: Referral to GI placed 07/225/2023. Pt aware the office will call re: appt.  Mammogram status: Ordered schedule 12/2021 per patient . Pt provided with contact info and advised to call to schedule appt.   Bone Density status: Completed 05/16/2019. Results reflect: Bone density results: OSTEOPENIA. Repeat every 5 years.  Lung Cancer Screening: (Low Dose CT Chest recommended if Age 70-80ears, 30 pack-year currently smoking OR have quit w/in 15years.) does not qualify.   Lung Cancer Screening  Referral: n/a  Additional Screening:  Hepatitis C Screening: does not qualify;   Vision Screening: Recommended annual ophthalmology exams for early detection of glaucoma and other disorders of the eye. Is the patient up to date with their annual eye exam?  Yes  Who is the provider or what is the name of the office in which the patient attends annual eye exams? Dr.Shipiro  If pt is not established with a provider, would they like to be referred to a provider to establish care? No .   Dental Screening: Recommended annual dental exams for proper oral hygiene  Community Resource Referral / Chronic Care Management: CRR required this visit?  No   CCM required this visit?  No      Plan:     I have personally reviewed and noted the following in the patient's chart:   Medical and social history Use of alcohol, tobacco or illicit drugs  Current medications and supplements including opioid prescriptions.  Functional ability and status Nutritional status Physical activity Advanced directives List of other physicians Hospitalizations, surgeries, and ER visits in previous 12 months Vitals Screenings to include cognitive, depression, and falls Referrals and appointments  In addition, I have reviewed and discussed with patient certain preventive protocols, quality metrics, and best practice recommendations. A written personalized care plan for preventive services as well as general preventive health recommendations were provided to patient.     LaDaphane ShepherdLPN   7/0/93/2671 Nurse Notes: none

## 2021-08-26 NOTE — Patient Instructions (Signed)
Sheri Griffith , Thank you for taking time to come for your Medicare Wellness Visit. I appreciate your ongoing commitment to your health goals. Please review the following plan we discussed and let me know if I can assist you in the future.   Screening recommendations/referrals: Colonoscopy: referral 08/26/2021 Mammogram: scheduled per patient 12/2021 Bone Density: 05/16/2019 Recommended yearly ophthalmology/optometry visit for glaucoma screening and checkup Recommended yearly dental visit for hygiene and checkup  Vaccinations: Influenza vaccine: due  Pneumococcal vaccine: due  Tdap vaccine: due  Shingles vaccine: will consider     Advanced directives: yes  Conditions/risks identified: none   Next appointment: none    Preventive Care 11 Years and Older, Female Preventive care refers to lifestyle choices and visits with your health care provider that can promote health and wellness. What does preventive care include? A yearly physical exam. This is also called an annual well check. Dental exams once or twice a year. Routine eye exams. Ask your health care provider how often you should have your eyes checked. Personal lifestyle choices, including: Daily care of your teeth and gums. Regular physical activity. Eating a healthy diet. Avoiding tobacco and drug use. Limiting alcohol use. Practicing safe sex. Taking low-dose aspirin every day. Taking vitamin and mineral supplements as recommended by your health care provider. What happens during an annual well check? The services and screenings done by your health care provider during your annual well check will depend on your age, overall health, lifestyle risk factors, and family history of disease. Counseling  Your health care provider may ask you questions about your: Alcohol use. Tobacco use. Drug use. Emotional well-being. Home and relationship well-being. Sexual activity. Eating habits. History of falls. Memory and ability  to understand (cognition). Work and work Statistician. Reproductive health. Screening  You may have the following tests or measurements: Height, weight, and BMI. Blood pressure. Lipid and cholesterol levels. These may be checked every 5 years, or more frequently if you are over 14 years old. Skin check. Lung cancer screening. You may have this screening every year starting at age 48 if you have a 30-pack-year history of smoking and currently smoke or have quit within the past 15 years. Fecal occult blood test (FOBT) of the stool. You may have this test every year starting at age 87. Flexible sigmoidoscopy or colonoscopy. You may have a sigmoidoscopy every 5 years or a colonoscopy every 10 years starting at age 52. Hepatitis C blood test. Hepatitis B blood test. Sexually transmitted disease (STD) testing. Diabetes screening. This is done by checking your blood sugar (glucose) after you have not eaten for a while (fasting). You may have this done every 1-3 years. Bone density scan. This is done to screen for osteoporosis. You may have this done starting at age 16. Mammogram. This may be done every 1-2 years. Talk to your health care provider about how often you should have regular mammograms. Talk with your health care provider about your test results, treatment options, and if necessary, the need for more tests. Vaccines  Your health care provider may recommend certain vaccines, such as: Influenza vaccine. This is recommended every year. Tetanus, diphtheria, and acellular pertussis (Tdap, Td) vaccine. You may need a Td booster every 10 years. Zoster vaccine. You may need this after age 69. Pneumococcal 13-valent conjugate (PCV13) vaccine. One dose is recommended after age 11. Pneumococcal polysaccharide (PPSV23) vaccine. One dose is recommended after age 20. Talk to your health care provider about which screenings and vaccines you  need and how often you need them. This information is not  intended to replace advice given to you by your health care provider. Make sure you discuss any questions you have with your health care provider. Document Released: 02/15/2015 Document Revised: 10/09/2015 Document Reviewed: 11/20/2014 Elsevier Interactive Patient Education  2017 [place] Prevention in the Home Falls can cause injuries. They can happen to people of all ages. There are many things you can do to make your home safe and to help prevent falls. What can I do on the outside of my home? Regularly fix the edges of walkways and driveways and fix any cracks. Remove anything that might make you trip as you walk through a door, such as a raised step or threshold. Trim any bushes or trees on the path to your home. Use bright outdoor lighting. Clear any walking paths of anything that might make someone trip, such as rocks or tools. Regularly check to see if handrails are loose or broken. Make sure that both sides of any steps have handrails. Any raised decks and porches should have guardrails on the edges. Have any leaves, snow, or ice cleared regularly. Use sand or salt on walking paths during winter. Clean up any spills in your garage right away. This includes oil or grease spills. What can I do in the bathroom? Use night lights. Install grab bars by the toilet and in the tub and shower. Do not use towel bars as grab bars. Use non-skid mats or decals in the tub or shower. If you need to sit down in the shower, use a plastic, non-slip stool. Keep the floor dry. Clean up any water that spills on the floor as soon as it happens. Remove soap buildup in the tub or shower regularly. Attach bath mats securely with double-sided non-slip rug tape. Do not have throw rugs and other things on the floor that can make you trip. What can I do in the bedroom? Use night lights. Make sure that you have a light by your bed that is easy to reach. Do not use any sheets or blankets that are  too big for your bed. They should not hang down onto the floor. Have a firm chair that has side arms. You can use this for support while you get dressed. Do not have throw rugs and other things on the floor that can make you trip. What can I do in the kitchen? Clean up any spills right away. Avoid walking on wet floors. Keep items that you use a lot in easy-to-reach places. If you need to reach something above you, use a strong step stool that has a grab bar. Keep electrical cords out of the way. Do not use floor polish or wax that makes floors slippery. If you must use wax, use non-skid floor wax. Do not have throw rugs and other things on the floor that can make you trip. What can I do with my stairs? Do not leave any items on the stairs. Make sure that there are handrails on both sides of the stairs and use them. Fix handrails that are broken or loose. Make sure that handrails are as long as the stairways. Check any carpeting to make sure that it is firmly attached to the stairs. Fix any carpet that is loose or worn. Avoid having throw rugs at the top or bottom of the stairs. If you do have throw rugs, attach them to the floor with carpet tape. Make sure that  you have a light switch at the top of the stairs and the bottom of the stairs. If you do not have them, ask someone to add them for you. What else can I do to help prevent falls? Wear shoes that: Do not have high heels. Have rubber bottoms. Are comfortable and fit you well. Are closed at the toe. Do not wear sandals. If you use a stepladder: Make sure that it is fully opened. Do not climb a closed stepladder. Make sure that both sides of the stepladder are locked into place. Ask someone to hold it for you, if possible. Clearly mark and make sure that you can see: Any grab bars or handrails. First and last steps. Where the edge of each step is. Use tools that help you move around (mobility aids) if they are needed. These  include: Canes. Walkers. Scooters. Crutches. Turn on the lights when you go into a dark area. Replace any light bulbs as soon as they burn out. Set up your furniture so you have a clear path. Avoid moving your furniture around. If any of your floors are uneven, fix them. If there are any pets around you, be aware of where they are. Review your medicines with your doctor. Some medicines can make you feel dizzy. This can increase your chance of falling. Ask your doctor what other things that you can do to help prevent falls. This information is not intended to replace advice given to you by your health care provider. Make sure you discuss any questions you have with your health care provider. Document Released: 11/15/2008 Document Revised: 06/27/2015 Document Reviewed: 02/23/2014 Elsevier Interactive Patient Education  2017 [employer].

## 2021-08-27 ENCOUNTER — Other Ambulatory Visit: Payer: Self-pay

## 2021-08-27 ENCOUNTER — Ambulatory Visit: Payer: Medicare Other | Admitting: Registered Nurse

## 2021-08-27 ENCOUNTER — Encounter: Payer: Self-pay | Admitting: Registered Nurse

## 2021-08-27 VITALS — BP 132/78 | HR 73 | Temp 98.0°F | Resp 18 | Ht 64.0 in | Wt 199.0 lb

## 2021-08-27 DIAGNOSIS — R7989 Other specified abnormal findings of blood chemistry: Secondary | ICD-10-CM | POA: Insufficient documentation

## 2021-08-27 DIAGNOSIS — E559 Vitamin D deficiency, unspecified: Secondary | ICD-10-CM

## 2021-08-27 DIAGNOSIS — Z1329 Encounter for screening for other suspected endocrine disorder: Secondary | ICD-10-CM | POA: Diagnosis not present

## 2021-08-27 DIAGNOSIS — Z13228 Encounter for screening for other metabolic disorders: Secondary | ICD-10-CM

## 2021-08-27 DIAGNOSIS — R61 Generalized hyperhidrosis: Secondary | ICD-10-CM

## 2021-08-27 DIAGNOSIS — Z130 Encounter for screening for diseases of the blood and blood-forming organs and certain disorders involving the immune mechanism: Secondary | ICD-10-CM

## 2021-08-27 DIAGNOSIS — I10 Essential (primary) hypertension: Secondary | ICD-10-CM | POA: Diagnosis not present

## 2021-08-27 DIAGNOSIS — L659 Nonscarring hair loss, unspecified: Secondary | ICD-10-CM | POA: Diagnosis not present

## 2021-08-27 DIAGNOSIS — Z13220 Encounter for screening for lipoid disorders: Secondary | ICD-10-CM

## 2021-08-27 LAB — LIPID PANEL
Cholesterol: 200 mg/dL (ref 0–200)
HDL: 64.6 mg/dL (ref >39.00)
LDL Cholesterol: 121 mg/dL — ABNORMAL HIGH (ref 0–99)
NonHDL: 135.69
Total CHOL/HDL Ratio: 3
Triglycerides: 73 mg/dL (ref 0.0–149.0)
VLDL: 14.6 mg/dL (ref 0.0–40.0)

## 2021-08-27 LAB — CBC WITH DIFFERENTIAL/PLATELET
Basophils Absolute: 0 10*3/uL (ref 0.0–0.1)
Basophils Relative: 0.5 % (ref 0.0–3.0)
Eosinophils Absolute: 0.1 10*3/uL (ref 0.0–0.7)
Eosinophils Relative: 1.5 % (ref 0.0–5.0)
HCT: 43.7 % (ref 36.0–46.0)
Hemoglobin: 14.1 g/dL (ref 12.0–15.0)
Lymphocytes Relative: 53.5 % — ABNORMAL HIGH (ref 12.0–46.0)
Lymphs Abs: 2.1 10*3/uL (ref 0.7–4.0)
MCHC: 32.4 g/dL (ref 30.0–36.0)
MCV: 89.4 fl (ref 78.0–100.0)
Monocytes Absolute: 0.4 10*3/uL (ref 0.1–1.0)
Monocytes Relative: 8.8 % (ref 3.0–12.0)
Neutro Abs: 1.4 10*3/uL (ref 1.4–7.7)
Neutrophils Relative %: 35.7 % — ABNORMAL LOW (ref 43.0–77.0)
Platelets: 180 10*3/uL (ref 150.0–400.0)
RBC: 4.89 Mil/uL (ref 3.87–5.11)
RDW: 13.4 % (ref 11.5–15.5)
WBC: 4 10*3/uL (ref 4.0–10.5)

## 2021-08-27 LAB — COMPREHENSIVE METABOLIC PANEL
ALT: 15 U/L (ref 0–35)
AST: 20 U/L (ref 0–37)
Albumin: 4.6 g/dL (ref 3.5–5.2)
Alkaline Phosphatase: 49 U/L (ref 39–117)
BUN: 13 mg/dL (ref 6–23)
CO2: 29 mEq/L (ref 19–32)
Calcium: 9.5 mg/dL (ref 8.4–10.5)
Chloride: 105 mEq/L (ref 96–112)
Creatinine, Ser: 0.93 mg/dL (ref 0.40–1.20)
GFR: 62.32 mL/min (ref >60.00)
Glucose, Bld: 93 mg/dL (ref 70–99)
Potassium: 4.2 mEq/L (ref 3.5–5.1)
Sodium: 140 mEq/L (ref 135–145)
Total Bilirubin: 1.2 mg/dL (ref 0.2–1.2)
Total Protein: 7.6 g/dL (ref 6.0–8.3)

## 2021-08-27 LAB — B12 AND FOLATE PANEL
Folate: 14.3 ng/mL (ref >5.9)
Vitamin B-12: 275 pg/mL (ref 211–911)

## 2021-08-27 LAB — HEMOGLOBIN A1C: Hgb A1c MFr Bld: 5.9 % (ref 4.6–6.5)

## 2021-08-27 LAB — TSH: TSH: 2.07 u[IU]/mL (ref 0.35–5.50)

## 2021-08-27 LAB — VITAMIN D 25 HYDROXY (VIT D DEFICIENCY, FRACTURES): VITD: 22.67 ng/mL — ABNORMAL LOW (ref 30.00–100.00)

## 2021-08-27 NOTE — Assessment & Plan Note (Signed)
Unclear etiology. No red flags. Labs collected. Will follow up with the patient as warranted. Monitor for changes.

## 2021-08-27 NOTE — Progress Notes (Signed)
Established Patient Office Visit  Subjective:  Patient ID: Sheri Griffith, female    DOB: Dec 06, 1950  Age: 71 y.o. MRN: [id number]  CC:  Chief Complaint  Patient presents with   Referral    Patient states she will like to discuss getting a referral to dermatology and have some questions about medication.    HPI Sheri Griffith presents for referral, medication questions  Referral Would like to be seen by dermatology Concern for hair thinning Started after seeing new stylist at salon in May.  Mostly front/top of head. Rear is still ok She had previously seen same stylist for 10-12 years.  No new home products, diet changes, etc.   Sweating  Mostly on chest/neck At night. No vaginal bleeding, coughing, weight loss, headaches, visual changes, globus sensation, nvd  Medication Wants to ensure she is taking vitamin D correctly. She is on 1046mg po qd No AE.  Outpatient Medications Prior to Visit  Medication Sig Dispense Refill   azelastine (ASTELIN) 0.1 % nasal spray Place 1 spray into both nostrils 2 (two) times daily. Use in each nostril as directed (Patient not taking: Reported on 08/26/2021) 30 mL 12   ciclopirox (PENLAC) 8 % solution APPLY TOPICALLY AT BEDTIME. APPLY OVER NAIL AND SURROUNDING SKIN. APPLY DAILY OVER PREVIOUS COAT. AFTER SEVEN (7) DAYS, MAY REMOVE WITH ALCOHOL AND CONTINUE CYCLE. (Patient not taking: Reported on 08/26/2021) 6.6 mL 0   cyclobenzaprine (FLEXERIL) 5 MG tablet Take 1 tablet (5 mg total) by mouth 3 (three) times daily as needed for muscle spasms. (Patient not taking: Reported on 08/26/2021) 30 tablet 1   gabapentin (NEURONTIN) 100 MG capsule Take 1 capsule (100 mg total) by mouth 3 (three) times daily. (Patient not taking: Reported on 08/26/2021) 90 capsule 3   meloxicam (MOBIC) 7.5 MG tablet Take 1 tablet (7.5 mg total) by mouth daily. (Patient not taking: Reported on 08/26/2021) 30 tablet 0   montelukast (SINGULAIR) 10 MG tablet Take 1 tablet (10 mg total)  by mouth at bedtime. (Patient not taking: Reported on 08/26/2021) 30 tablet 3   pantoprazole (PROTONIX) 40 MG tablet Take 1 tablet (40 mg total) by mouth daily. (Patient not taking: Reported on 08/26/2021) 30 tablet 3   Vitamin D, Ergocalciferol, (DRISDOL) 1.25 MG (50000 UNIT) CAPS capsule TAKE 1 CAPSULE (50,000 UNITS TOTAL) BY MOUTH EVERY 7 (SEVEN) DAYS (Patient not taking: Reported on 08/27/2021) 12 capsule 0   No facility-administered medications prior to visit.    Review of Systems  Constitutional: Negative.   HENT: Negative.    Eyes: Negative.   Respiratory: Negative.    Cardiovascular: Negative.   Gastrointestinal: Negative.   Genitourinary: Negative.   Musculoskeletal: Negative.   Skin: Negative.   Neurological: Negative.   Psychiatric/Behavioral: Negative.    All other systems reviewed and are negative.     Objective:     BP 132/78   Pulse 73   Temp 98 F (36.7 C) (Temporal)   Resp 18   Ht '5\' 4"'$  (1.626 m)   Wt 199 lb (90.3 kg)   SpO2 99%   BMI 34.16 kg/m   Wt Readings from Last 3 Encounters:  08/27/21 199 lb (90.3 kg)  11/06/20 194 lb 14.4 oz (88.4 kg)  10/22/20 193 lb 3.2 oz (87.6 kg)   Physical Exam Vitals and nursing note reviewed.  Constitutional:      General: She is not in acute distress.    Appearance: Normal appearance. She is normal weight. She is not  ill-appearing, toxic-appearing or diaphoretic.  Cardiovascular:     Rate and Rhythm: Normal rate and regular rhythm.     Heart sounds: Normal heart sounds. No murmur heard.    No friction rub. No gallop.  Pulmonary:     Effort: Pulmonary effort is normal. No respiratory distress.     Breath sounds: Normal breath sounds. No stridor. No wheezing, rhonchi or rales.  Chest:     Chest wall: No tenderness.  Skin:    General: Skin is warm and dry.     Coloration: Skin is not jaundiced or pale.     Findings: No bruising, erythema, lesion or rash.     Comments: Thinning hair, question of patchy loss?   Neurological:     General: No focal deficit present.     Mental Status: She is alert and oriented to person, place, and time. Mental status is at baseline.  Psychiatric:        Mood and Affect: Mood normal.        Behavior: Behavior normal.        Thought Content: Thought content normal.        Judgment: Judgment normal.     No results found for any visits on 08/27/21.    The 10-year ASCVD risk score (Arnett DK, et al., 2019) is: 13.5%    Assessment & Plan:   Problem List Items Addressed This Visit       Other   Hair thinning - Primary    Labs collected. Will follow up with the patient as warranted. Refer to derm Trauma from hair treatment likely but will check labs as well.      Relevant Orders   Ambulatory referral to Dermatology   B12 and Folate Panel   Vitamin D deficiency    Labs collected. Will follow up with the patient as warranted.       Relevant Orders   Vitamin D (25 hydroxy)   Abnormal CBC    Labs collected. Will follow up with the patient as warranted.       Relevant Orders   CBC with Differential/Platelet   Night sweats    Unclear etiology. No red flags. Labs collected. Will follow up with the patient as warranted. Monitor for changes.       Other Visit Diagnoses     Screening for endocrine, metabolic and immunity disorder       Relevant Orders   Comprehensive metabolic panel   Hemoglobin A1c   TSH   Lipid screening       Relevant Orders   Lipid panel       No orders of the defined types were placed in this encounter.   Return in about 1 year (around 08/28/2022) for CPE and labs.   PLAN Refer to derm Encouraged continued vitamin d use. Will check labs, address as indicated by results Patient encouraged to call clinic with any questions, comments, or concerns.   Sheri Coss, NP

## 2021-08-27 NOTE — Assessment & Plan Note (Signed)
Labs collected. Will follow up with the patient as warranted.  

## 2021-08-27 NOTE — Patient Instructions (Addendum)
Ms. Stockburger -   Doristine Devoid to see you!  I have referred to dermatology - they may take a while to see you but I'm hoping for something sooner than December.  I will check on labs and let you know how results look.   I recommend these providers:  Inda Coke, PA Berniece Pap, MD Myrna Blazer Early, NP Jeralyn Ruths, DNP  Stay well, and thank you for letting me take part in your care,  Rich   If you have lab work done today you will be contacted with your lab results within the next 2 weeks.  If you have not heard from [place] then please contact us. The fastest way to get your results is to register for My Chart.   IF you received an x-ray today, you will receive an invoice from [facility]. Please contact [facility] at [phone] with questions or concerns regarding your invoice.   IF you received labwork today, you will receive an invoice from [place]. Please contact [facility] at [phone] with questions or concerns regarding your invoice.   Our billing staff will not be able to assist you with questions regarding bills from these companies.  You will be contacted with the lab results as soon as they are available. The fastest way to get your results is to activate your My Chart account. Instructions are located on the last page of this paperwork. If you have not heard from [place] regarding the results in 2 weeks, please contact this office.

## 2021-08-27 NOTE — Assessment & Plan Note (Signed)
Labs collected. Will follow up with the patient as warranted. Refer to derm Trauma from hair treatment likely but will check labs as well.

## 2021-08-28 ENCOUNTER — Telehealth: Payer: Self-pay

## 2021-08-28 NOTE — Telephone Encounter (Signed)
-----   Message from Maximiano Coss, NP sent at 08/28/2021  7:30 AM EDT ----- If we could call Ms. Sheri Griffith -   She still has a vitamin D deficiency. I want her to take 2 of those tablets she had yesterday each day. Recheck in 6-12 mo.   Otherwise, her labs are great. We are seeing improvements in her sugars and cholesterol, her blood counts are normal, there are no concerns with her liver, kidneys, or thyroid.   She would also like a letter mailed to her so she can review these results - ok to copy and paste this message into that letter.  Thank you!  Rich

## 2021-08-28 NOTE — Telephone Encounter (Signed)
Spoke w/ pt and advised of lab results  

## 2021-09-04 ENCOUNTER — Encounter: Payer: Self-pay | Admitting: Family Medicine

## 2021-09-04 ENCOUNTER — Ambulatory Visit (INDEPENDENT_AMBULATORY_CARE_PROVIDER_SITE_OTHER): Payer: Medicare Other | Admitting: Family Medicine

## 2021-09-04 VITALS — BP 138/88 | HR 77 | Temp 97.7°F | Resp 16 | Ht 64.0 in | Wt 197.1 lb

## 2021-09-04 DIAGNOSIS — J309 Allergic rhinitis, unspecified: Secondary | ICD-10-CM | POA: Diagnosis not present

## 2021-09-04 MED ORDER — CETIRIZINE HCL 5 MG/5ML PO SOLN: 10.0000 mg | Freq: Every day | ORAL | 1 refills | Status: DC

## 2021-09-04 MED ORDER — MOMETASONE FUROATE 50 MCG/ACT NA SUSP: 2.0000 | Freq: Every day | NASAL | 12 refills | Status: AC

## 2021-09-04 NOTE — Progress Notes (Signed)
   Subjective:    Patient ID: Sheri Griffith, female    DOB: 1950/03/25, 71 y.o.   MRN: [id number]  HPI URI- pt reports nasal congestion, 'dripping'.  Last night had PND that caused productive cough.  Sxs started last night.  Denies facial pain/pressure.  No ear pain.  No HA.  No fevers or chills.  No body aches.  Denies chest tightness/wheezing/SOB.  Not currently on antihistamine.  Not on Singulair.  Not using nasal spray.   Review of Systems For ROS see HPI     Objective:   Physical Exam Vitals reviewed.  Constitutional:      General: She is not in acute distress.    Appearance: Normal appearance. She is well-developed. She is not ill-appearing.  HENT:     Head: Normocephalic and atraumatic.     Right Ear: Tympanic membrane normal.     Left Ear: Tympanic membrane normal.     Nose: Mucosal edema, congestion and rhinorrhea present.     Right Sinus: No maxillary sinus tenderness or frontal sinus tenderness.     Left Sinus: No maxillary sinus tenderness or frontal sinus tenderness.     Mouth/Throat:     Pharynx: Posterior oropharyngeal erythema (w/ PND) present.  Eyes:     Conjunctiva/sclera: Conjunctivae normal.     Pupils: Pupils are equal, round, and reactive to light.  Cardiovascular:     Rate and Rhythm: Normal rate and regular rhythm.     Heart sounds: Normal heart sounds.  Pulmonary:     Effort: Pulmonary effort is normal. No respiratory distress.     Breath sounds: Normal breath sounds. No wheezing or rales.  Musculoskeletal:     Cervical back: Normal range of motion and neck supple.  Lymphadenopathy:     Cervical: No cervical adenopathy.  Skin:    General: Skin is warm and dry.  Neurological:     General: No focal deficit present.     Mental Status: She is alert and oriented to person, place, and time.  Psychiatric:        Mood and Affect: Mood normal.        Behavior: Behavior normal.        Thought Content: Thought content normal.          Assessment &  Plan:  Allergic rhinitis- new.  No evidence of infxn and pt's sxs started just last night.  No need for abx.  Start daily antihistamine and nasal steroid.  Reviewed supportive care and red flags that should prompt return.  Pt expressed understanding and is in agreement w/ plan.

## 2021-09-04 NOTE — Patient Instructions (Signed)
Follow up as needed or as scheduled START the liquid Cetirizine (Zyrtec) daily as directed USE the nasal spray- 2 sprays each nostril Drink LOTS of water to rinse the drainage off the back of your throat Use OTC Delsym as needed for cough Call with any questions or concerns Hang in there!!

## 2021-10-23 ENCOUNTER — Ambulatory Visit: Payer: Medicare Other | Admitting: Podiatry

## 2021-10-23 DIAGNOSIS — M79674 Pain in right toe(s): Secondary | ICD-10-CM

## 2021-10-23 DIAGNOSIS — M79675 Pain in left toe(s): Secondary | ICD-10-CM

## 2021-10-23 DIAGNOSIS — B351 Tinea unguium: Secondary | ICD-10-CM

## 2021-10-23 NOTE — Progress Notes (Signed)
  Subjective:  Patient ID: Sheri Griffith, female    DOB: 01/02/1951,  MRN: [id number]  Chief Complaint  Patient presents with   Nail Problem    Nail trim    71 y.o. female returns for the above complaint.  Patient presents with thickened elongated dystrophic toenails x10.  Painful to touch.  She would like to have them debrided out as she is not able to do it herself.  She is not a diabetic.  She denies any other acute complaints.  Objective:  There were no vitals filed for this visit. Podiatric Exam: Vascular: dorsalis pedis and posterior tibial pulses are palpable bilateral. Capillary return is immediate. Temperature gradient is WNL. Skin turgor WNL  Sensorium: Normal Semmes Weinstein monofilament test. Normal tactile sensation bilaterally. Nail Exam: Pt has thick disfigured discolored nails with subungual debris noted bilateral entire nail hallux through fifth toenails.  Pain on palpation to the nails. Ulcer Exam: There is no evidence of ulcer or pre-ulcerative changes or infection. Orthopedic Exam: Muscle tone and strength are WNL. No limitations in general ROM. No crepitus or effusions noted. HAV  B/L.  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 Skin: No Porokeratosis. No infection or ulcers    Assessment & Plan:   No diagnosis found.    Patient was evaluated and treated and all questions answered.  Onychomycosis with pain  -Nails palliatively debrided as below. -Educated on self-care  Procedure: Nail Debridement Rationale: pain  Type of Debridement: manual, sharp debridement. Instrumentation: Nail nipper, rotary burr. Number of Nails: 10  Procedures and Treatment: Consent by patient was obtained for treatment procedures. The patient understood the discussion of treatment and procedures well. All questions were answered thoroughly reviewed. Debridement of mycotic and hypertrophic toenails, 1 through 5 bilateral and clearing of subungual debris. No ulceration, no infection noted.  Return  Visit-Office Procedure: Patient instructed to return to the office for a follow up visit 3 months for continued evaluation and treatment.  Boneta Lucks, DPM    No follow-ups on file.

## 2021-11-14 ENCOUNTER — Encounter: Payer: Self-pay | Admitting: Registered Nurse

## 2021-12-31 ENCOUNTER — Encounter: Payer: Self-pay | Admitting: Podiatry

## 2022-01-14 ENCOUNTER — Ambulatory Visit: Payer: Medicare Other | Admitting: Podiatry

## 2022-01-29 LAB — HM MAMMOGRAPHY

## 2022-02-18 ENCOUNTER — Ambulatory Visit: Payer: Medicare Other | Admitting: Podiatry

## 2022-03-12 ENCOUNTER — Ambulatory Visit (INDEPENDENT_AMBULATORY_CARE_PROVIDER_SITE_OTHER): Payer: Medicare Other | Admitting: Podiatry

## 2022-03-12 DIAGNOSIS — M79675 Pain in left toe(s): Secondary | ICD-10-CM

## 2022-03-12 DIAGNOSIS — B351 Tinea unguium: Secondary | ICD-10-CM | POA: Diagnosis not present

## 2022-03-12 DIAGNOSIS — M79674 Pain in right toe(s): Secondary | ICD-10-CM

## 2022-03-12 NOTE — Progress Notes (Signed)
  Subjective:  Patient ID: Sheri Griffith, female    DOB: 09/26/1950,  MRN: [id number]  Chief Complaint  Patient presents with   Nail Problem   72 y.o. female returns for the above complaint.  Patient presents with thickened elongated dystrophic toenails x10.  Painful to touch.  She would like to have them debrided out as she is not able to do it herself.  She is not a diabetic.  She denies any other acute complaints.  Objective:  There were no vitals filed for this visit. Podiatric Exam: Vascular: dorsalis pedis and posterior tibial pulses are palpable bilateral. Capillary return is immediate. Temperature gradient is WNL. Skin turgor WNL  Sensorium: Normal Semmes Weinstein monofilament test. Normal tactile sensation bilaterally. Nail Exam: Pt has thick disfigured discolored nails with subungual debris noted bilateral entire nail hallux through fifth toenails.  Pain on palpation to the nails. Ulcer Exam: There is no evidence of ulcer or pre-ulcerative changes or infection. Orthopedic Exam: Muscle tone and strength are WNL. No limitations in general ROM. No crepitus or effusions noted. HAV  B/L.  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 Skin: No Porokeratosis. No infection or ulcers    Assessment & Plan:   No diagnosis found.    Patient was evaluated and treated and all questions answered.  Onychomycosis with pain  -Nails palliatively debrided as below. -Educated on self-care  Procedure: Nail Debridement Rationale: pain  Type of Debridement: manual, sharp debridement. Instrumentation: Nail nipper, rotary burr. Number of Nails: 10  Procedures and Treatment: Consent by patient was obtained for treatment procedures. The patient understood the discussion of treatment and procedures well. All questions were answered thoroughly reviewed. Debridement of mycotic and hypertrophic toenails, 1 through 5 bilateral and clearing of subungual debris. No ulceration, no infection noted.  Return Visit-Office  Procedure: Patient instructed to return to the office for a follow up visit 3 months for continued evaluation and treatment.  Boneta Lucks, DPM    No follow-ups on file.

## 2022-04-03 ENCOUNTER — Ambulatory Visit: Payer: Medicare Other | Admitting: Family Medicine

## 2022-04-03 ENCOUNTER — Encounter: Payer: Self-pay | Admitting: Family Medicine

## 2022-04-03 VITALS — BP 160/90 | HR 66 | Temp 98.1°F | Ht 62.0 in | Wt 202.1 lb

## 2022-04-03 DIAGNOSIS — I10 Essential (primary) hypertension: Secondary | ICD-10-CM | POA: Diagnosis not present

## 2022-04-03 DIAGNOSIS — G8929 Other chronic pain: Secondary | ICD-10-CM

## 2022-04-03 DIAGNOSIS — M5431 Sciatica, right side: Secondary | ICD-10-CM

## 2022-04-03 DIAGNOSIS — Z7689 Persons encountering health services in other specified circumstances: Secondary | ICD-10-CM

## 2022-04-03 MED ORDER — CYCLOBENZAPRINE HCL 5 MG PO TABS: 5.0000 mg | ORAL_TABLET | Freq: Three times a day (TID) | ORAL | 1 refills | Status: AC | PRN

## 2022-04-03 NOTE — Progress Notes (Unsigned)
New Patient Office Visit  Subjective    Patient ID: Sheri Griffith, female    DOB: 18-Jul-1950  Age: 72 y.o. MRN: [id number]  CC:  Chief Complaint  Patient presents with   Establish Care    Pt is here today to [facility] . Pt is requesting refills today . Pt has pain from sciatica nerve, radiating down rt leg. 3 days. Pt reports hx of this. Pt reports heat pad on rt leg and ice on knee .   Sciatica    HPI Sheri Griffith presents to establish care with new provider and chronic management.   Sciatica pain: Patient reports when is having to sit for long periods of time, she notice a flare up with her sciatica pain. 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 This started 3-4 days ago. Pain starts in posterior right upper leg radiates to right calf. Pain is described as throbbing at upper leg, sharp calf. Intermittent pain. Resting improves the pain and ambulating makes the pain worse. Denies numbness and tingling. Wearing right knee brace.-started this week.  Patient reports Flexeril '5mg'$  TID as needed.  Phyiscal therapy with [facility] 1.5 years ago.  Last had Flexeril about a year ago. Reports it has helped in the past. Not much sleep.   Last took blood pressure medication years ago., probably about 4-5 years ago. Denies chest pain or shortness of breath.     Outpatient Encounter Medications as of 04/03/2022  Medication Sig   azelastine (ASTELIN) 0.1 % nasal spray Place 1 spray into both nostrils 2 (two) times daily. Use in each nostril as directed   cyclobenzaprine (FLEXERIL) 5 MG tablet Take 1 tablet (5 mg total) by mouth 3 (three) times daily as needed for muscle spasms.   Vitamin D, Ergocalciferol, (DRISDOL) 1.25 MG (50000 UNIT) CAPS capsule TAKE 1 CAPSULE (50,000 UNITS TOTAL) BY MOUTH EVERY 7 (SEVEN) DAYS   cetirizine HCl (ZYRTEC) 5 MG/5ML SOLN Take 10 mLs (10 mg total) by mouth daily. (Patient not taking: Reported on 04/03/2022)   ciclopirox (PENLAC) 8 % solution APPLY TOPICALLY AT BEDTIME. APPLY  OVER NAIL AND SURROUNDING SKIN. APPLY DAILY OVER PREVIOUS COAT. AFTER SEVEN (7) DAYS, MAY REMOVE WITH ALCOHOL AND CONTINUE CYCLE. (Patient not taking: Reported on 04/03/2022)   gabapentin (NEURONTIN) 100 MG capsule Take 1 capsule (100 mg total) by mouth 3 (three) times daily. (Patient not taking: Reported on 08/26/2021)   meloxicam (MOBIC) 7.5 MG tablet Take 1 tablet (7.5 mg total) by mouth daily. (Patient not taking: Reported on 08/26/2021)   mometasone (NASONEX) 50 MCG/ACT nasal spray Place 2 sprays into the nose daily.   montelukast (SINGULAIR) 10 MG tablet Take 1 tablet (10 mg total) by mouth at bedtime. (Patient not taking: Reported on 08/26/2021)   pantoprazole (PROTONIX) 40 MG tablet Take 1 tablet (40 mg total) by mouth daily. (Patient not taking: Reported on 08/26/2021)   No facility-administered encounter medications on file as of 04/03/2022.    Past Medical History:  Diagnosis Date   Arthritis of knee    Hypertension    Sciatica    Sinus problem     Past Surgical History:  Procedure Laterality Date   NONE      Family History  Problem Relation Age of Onset   Other Mother        staph infection   Arthritis Father    Other Father        died age 37   Colon cancer Neg Hx  Rectal cancer Neg Hx    Esophageal cancer Neg Hx    Liver cancer Neg Hx     Social History   Socioeconomic History   Marital status: Married    Spouse name: Not on file   Number of children: 2   Years of education: Not on file   Highest education level: Not on file  Occupational History   Occupation: Oceanographer  Tobacco Use   Smoking status: Never   Smokeless tobacco: Never  Substance and Sexual Activity   Alcohol use: Not Currently    Comment: occasional use   Drug use: No   Sexual activity: Yes    Birth control/protection: None  Other Topics Concern   Not on file  Social History Narrative   Not on file   Social Determinants of Health   Financial Resource Strain: Low Risk   (08/26/2021)   Overall Financial Resource Strain (CARDIA)    Difficulty of Paying Living Expenses: Not hard at all  Food Insecurity: No Food Insecurity (08/26/2021)   Hunger Vital Sign    Worried About Running Out of Food in the Last Year: Never true    [place] in the Last Year: Never true  Transportation Needs: No Transportation Needs (08/26/2021)   PRAPARE - Hydrologist (Medical): No    Lack of Transportation (Non-Medical): No  Physical Activity: Sufficiently Active (08/26/2021)   Exercise Vital Sign    Days of Exercise per Week: 4 days    Minutes of Exercise per Session: 60 min  Stress: No Stress Concern Present (08/26/2021)   [place]    Feeling of Stress : Not at all  Social Connections: [place] (08/26/2021)   Social Connection and Isolation Panel [NHANES]    Frequency of Communication with Friends and Family: Three times a week    Frequency of Social Gatherings with Friends and Family: Three times a week    Attends Religious Services: 1 to 4 times per year    Active Member of Clubs or Organizations: Yes    Attends Archivist Meetings: 1 to 4 times per year    Marital Status: Married  Human resources officer Violence: Not At Risk (08/26/2021)   Humiliation, Afraid, Rape, and Kick questionnaire    Fear of Current or Ex-Partner: No    Emotionally Abused: No    Physically Abused: No    Sexually Abused: No    ROS See HPI above    Objective    BP (!) 172/100   Pulse 66   Temp 98.1 F (36.7 C)   Ht '5\' 2"'$  (1.575 m)   Wt 202 lb 2 oz (91.7 kg)   SpO2 98%   BMI 36.97 kg/m   Physical Exam    Assessment & Plan:  Chronic bilateral low back pain with right-sided sciatica    No follow-ups on file.   Valarie Merino, NP

## 2022-04-03 NOTE — Patient Instructions (Addendum)
It was a pleasure to meet you and I look forward to taking care of you! -Blood pressure is elevated today, concerned that it may be uncontrolled. But, hoping it is from pain and not being able to sleep due to the pain. Recommend to take blood pressure twice a day, once in the morning and once in the evening. Right your blood pressures down and bring to your next appointment.  -If you develop chest pain, shortness of breath, dizziness, lightheadedness, or worst headache, you need to go to the emergency department or call 911.  -Recommend to alternate Tylenol '1000mg'$  and Ibuprofen 600-'800mg'$  every 4 hours. If you take Ibuprofen, you need to eat something with it. -You may use ice or heat compressions 20 minutes at a time, 4-6 times a day.  -Refilling Flexeril for pain as needed. Advised caution with this medication having the potential for drowsiness. Please do not drive or operate machinery while taking medication. -Follow up in 1 week with blood pressures readings and will need labs.

## 2022-04-05 DIAGNOSIS — M5431 Sciatica, right side: Secondary | ICD-10-CM | POA: Insufficient documentation

## 2022-04-05 NOTE — Assessment & Plan Note (Signed)
Blood pressure is elevated today, slowly improving during visit. Concerned that it may be uncontrolled. But, hoping it is from pain and not being able to sleep due to the pain. Recommend patient take her blood pressure twice a day, once in the morning and once in the evening. Bring her readings to her next appointment. If still elevated, will need to restart BP medication. Advised she if she develop chest pain, shortness of breath, dizziness, lightheadedness, or worst headache, to go to the emergency department or call 911.

## 2022-04-05 NOTE — Assessment & Plan Note (Signed)
Refilled Flexeril for pain as needed up to 3 times a day. Advised caution with taking this medication having the potential for drowsiness. Please do not drive or operate machinery while taking medication. Recommend to alternate Tylenol '1000mg'$  and Ibuprofen 600-'800mg'$  every 4 hours. If taking Ibuprofen, she needs to eat something with it. She may use ice or heat compressions 20 minutes at a time, 4-6 times a day to help with pain. If not improved, may need another referral to physical therapy.

## 2022-04-10 ENCOUNTER — Other Ambulatory Visit: Payer: Medicare Other

## 2022-04-17 ENCOUNTER — Other Ambulatory Visit: Payer: Medicare Other

## 2022-04-17 DIAGNOSIS — E559 Vitamin D deficiency, unspecified: Secondary | ICD-10-CM

## 2022-04-17 DIAGNOSIS — Z13220 Encounter for screening for lipoid disorders: Secondary | ICD-10-CM

## 2022-04-17 DIAGNOSIS — I10 Essential (primary) hypertension: Secondary | ICD-10-CM

## 2022-04-17 DIAGNOSIS — Z130 Encounter for screening for diseases of the blood and blood-forming organs and certain disorders involving the immune mechanism: Secondary | ICD-10-CM

## 2022-04-18 LAB — CBC WITH DIFFERENTIAL/PLATELET
Absolute Monocytes: 347 cells/uL (ref 200–950)
Basophils Absolute: 20 cells/uL (ref 0–200)
Basophils Relative: 0.5 %
Eosinophils Absolute: 59 cells/uL (ref 15–500)
Eosinophils Relative: 1.5 %
HCT: 42.9 % (ref 35.0–45.0)
Hemoglobin: 14.7 g/dL (ref 11.7–15.5)
Lymphs Abs: 2410 cells/uL (ref 850–3900)
MCH: 29.2 pg (ref 27.0–33.0)
MCHC: 34.3 g/dL (ref 32.0–36.0)
MCV: 85.3 fL (ref 80.0–100.0)
MPV: 10.5 fL (ref 7.5–12.5)
Monocytes Relative: 8.9 %
Neutro Abs: 1065 cells/uL — ABNORMAL LOW (ref 1500–7800)
Neutrophils Relative %: 27.3 %
Platelets: 177 10*3/uL (ref 140–400)
RBC: 5.03 10*6/uL (ref 3.80–5.10)
RDW: 12.8 % (ref 11.0–15.0)
Total Lymphocyte: 61.8 %
WBC: 3.9 10*3/uL (ref 3.8–10.8)

## 2022-04-18 LAB — LIPID PANEL
Cholesterol: 177 mg/dL (ref <200)
HDL: 63 mg/dL (ref >=50)
LDL Cholesterol (Calc): 101 mg/dL (calc) — ABNORMAL HIGH
Non-HDL Cholesterol (Calc): 114 mg/dL (calc) (ref <130)
Total CHOL/HDL Ratio: 2.8 (calc) (ref <5.0)
Triglycerides: 52 mg/dL (ref <150)

## 2022-04-18 LAB — COMPREHENSIVE METABOLIC PANEL
AG Ratio: 1.7 (calc) (ref 1.0–2.5)
ALT: 15 U/L (ref 6–29)
AST: 16 U/L (ref 10–35)
Albumin: 4.6 g/dL (ref 3.6–5.1)
Alkaline phosphatase (APISO): 58 U/L (ref 37–153)
BUN: 16 mg/dL (ref 7–25)
CO2: 27 mmol/L (ref 20–32)
Calcium: 9.6 mg/dL (ref 8.6–10.4)
Chloride: 104 mmol/L (ref 98–110)
Creat: 0.95 mg/dL (ref 0.60–1.00)
Globulin: 2.7 g/dL (calc) (ref 1.9–3.7)
Glucose, Bld: 92 mg/dL (ref 65–99)
Potassium: 4.5 mmol/L (ref 3.5–5.3)
Sodium: 139 mmol/L (ref 135–146)
Total Bilirubin: 0.8 mg/dL (ref 0.2–1.2)
Total Protein: 7.3 g/dL (ref 6.1–8.1)

## 2022-04-18 LAB — VITAMIN D 25 HYDROXY (VIT D DEFICIENCY, FRACTURES): Vit D, 25-Hydroxy: 35 ng/mL (ref 30–100)

## 2022-04-18 LAB — VITAMIN B12: Vitamin B-12: 331 pg/mL (ref 200–1100)

## 2022-04-18 LAB — TSH: TSH: 2.42 mIU/L (ref 0.40–4.50)

## 2022-04-20 ENCOUNTER — Telehealth: Payer: Self-pay

## 2022-04-20 NOTE — Telephone Encounter (Signed)
Lvm for patient to return call in regards to labs 

## 2022-04-20 NOTE — Telephone Encounter (Signed)
-----   Message from Evangeline Gula, NP sent at 04/19/2022 10:46 AM EDT ----- Labs are stable. Recommend to continue with a healthy diet and regular exercise.

## 2022-04-20 NOTE — Progress Notes (Signed)
Left vm for pt to call about labs.

## 2022-04-20 NOTE — Progress Notes (Signed)
Left vm for patient to call office about labs.

## 2022-04-20 NOTE — Telephone Encounter (Signed)
Left vm for pt to call office 

## 2022-04-21 NOTE — Telephone Encounter (Signed)
Left vm to call office

## 2022-04-21 NOTE — Telephone Encounter (Signed)
Patient called back to speak with Sheri Griffith

## 2022-04-21 NOTE — Telephone Encounter (Signed)
Pt called back. °

## 2022-04-24 ENCOUNTER — Ambulatory Visit: Payer: Medicare Other | Admitting: Family Medicine

## 2022-04-24 DIAGNOSIS — I10 Essential (primary) hypertension: Secondary | ICD-10-CM

## 2022-04-24 DIAGNOSIS — Z09 Encounter for follow-up examination after completed treatment for conditions other than malignant neoplasm: Secondary | ICD-10-CM

## 2022-05-08 ENCOUNTER — Encounter: Payer: Self-pay | Admitting: Family Medicine

## 2022-05-08 ENCOUNTER — Ambulatory Visit (INDEPENDENT_AMBULATORY_CARE_PROVIDER_SITE_OTHER): Payer: Medicare Other | Admitting: Family Medicine

## 2022-05-08 VITALS — BP 130/78 | HR 78 | Temp 98.1°F | Ht 62.0 in | Wt 202.4 lb

## 2022-05-08 DIAGNOSIS — Z712 Person consulting for explanation of examination or test findings: Secondary | ICD-10-CM

## 2022-05-08 DIAGNOSIS — I10 Essential (primary) hypertension: Secondary | ICD-10-CM

## 2022-05-08 MED ORDER — AMLODIPINE BESYLATE 2.5 MG PO TABS
2.5000 mg | ORAL_TABLET | Freq: Every day | ORAL | 0 refills | Status: AC
Start: 2022-05-08 — End: ?

## 2022-05-08 NOTE — Patient Instructions (Signed)
-  START Amlodipine 2.5 mg once a day. Recommend to take medication either every morning or every night. Recommend to monitor her blood pressure twice daily, every morning and every evening. Bring readings in at 2 week follow up. -Discussed about decrease salt and exercising.  -Review over labs from previous appointment and answered all question. If you ever have questions about labs, please call back to the office or send me a message through MyChart.

## 2022-05-08 NOTE — Progress Notes (Signed)
Established Patient Office Visit   Subjective:  Patient ID: Sheri Griffith, female    DOB: 02/05/1950  Age: 72 y.o. MRN: [id number]  Chief Complaint  Patient presents with   Hypertension    Pt is here today for B/P log from home. Pt would like to discuss labs today.    HPI On previous visit, patients blood pressure was elevated. She is following up for blood pressure management. Patient brought in 5 blood pressure readings. All 5 blood pressure readings were elevated, 155-159/83-86.  BP Readings from Last 3 Encounters:  05/08/22 130/78  04/03/22 (!) 160/90  09/04/21 138/88     ROS See HPI above     Objective:   BP 130/78   Pulse 78   Temp 98.1 F (36.7 C)   Ht 5\' 2"  (1.575 m)   Wt 202 lb 6 oz (91.8 kg)   SpO2 98%   BMI 37.01 kg/m    Physical Exam Vitals reviewed.  Constitutional:      General: She is not in acute distress.    Appearance: Normal appearance. She is not ill-appearing, toxic-appearing or diaphoretic.  Eyes:     General:        Right eye: No discharge.        Left eye: No discharge.     Conjunctiva/sclera: Conjunctivae normal.  Cardiovascular:     Rate and Rhythm: Normal rate and regular rhythm.     Heart sounds: Normal heart sounds. No murmur heard.    No friction rub. No gallop.  Pulmonary:     Effort: Pulmonary effort is normal. No respiratory distress.     Breath sounds: Normal breath sounds.  Musculoskeletal:        General: Normal range of motion.     Right lower leg: No edema.     Left lower leg: No edema.  Skin:    General: Skin is warm and dry.  Neurological:     General: No focal deficit present.     Mental Status: She is alert and oriented to person, place, and time. Mental status is at baseline.  Psychiatric:        Mood and Affect: Mood normal.        Behavior: Behavior normal.        Thought Content: Thought content normal.        Judgment: Judgment normal.    The 10-year ASCVD risk score (Arnett DK, et al., 2019) is:  11.9%   Assessment & Plan:  Uncontrolled hypertension Assessment & Plan: Blood pressures are not controlled at home and was elevated at last visit. Through shared decision making, between CCB and Thiazide, decided on Amlodipine 2.5 mg once a day. Recommend to take medication either every morning or every night. Recommend to monitor her blood pressure twice daily, every morning and every evening. Bring readings in at 2 week follow up. Discussed about DASH diet and managing hypertension. Provided written material. Also, recommend to exercise, such as walking on a regular regiment.   Orders: -     amLODIPine Besylate; Take 1 tablet (2.5 mg total) by mouth daily.  Dispense: 90 tablet; Refill: 0  Encounter to discuss test results  1.Review health maintenance: -Declined covid vaccine and booster -Declined Tdap vaccine -Declined Shingrix -Declined PNA vaccine -Mammogram completed in 12/23, with [facility]. Requesting records. -Colonoscopy-would like to hold on referral.  -Ophthalmology Eye Exam was [facility]-December 2023, requesting records.  2.Discussed over labs from previous appointment and answered all  questions. Advised if she ever had any questions about labs, please call the office or send a message through MyChart.   Return in about 2 weeks (around 05/22/2022) for follow-up: blood pressure.   Zandra Abts, NP

## 2022-05-10 NOTE — Assessment & Plan Note (Addendum)
Blood pressures are not controlled at home and was elevated at last visit. Through shared decision making, between CCB and Thiazide, decided on Amlodipine 2.5 mg once a day. Recommend to take medication either every morning or every night. Recommend to monitor her blood pressure twice daily, every morning and every evening. Bring readings in at 2 week follow up. Discussed about DASH diet and managing hypertension. Provided written material. Also, recommend to exercise, such as walking on a regular regiment.

## 2022-05-18 ENCOUNTER — Encounter: Payer: Self-pay | Admitting: *Deleted

## 2022-05-24 ENCOUNTER — Encounter: Payer: Self-pay | Admitting: Family Medicine

## 2022-06-08 ENCOUNTER — Ambulatory Visit: Payer: Medicare Other | Admitting: Family Medicine

## 2022-06-17 ENCOUNTER — Ambulatory Visit: Payer: Medicare Other | Admitting: Podiatry

## 2022-06-25 ENCOUNTER — Ambulatory Visit: Payer: Medicare Other | Admitting: Podiatry

## 2022-07-10 ENCOUNTER — Encounter: Payer: Self-pay | Admitting: Podiatry

## 2022-07-20 ENCOUNTER — Ambulatory Visit: Payer: Medicare Other | Admitting: Podiatry

## 2022-11-06 ENCOUNTER — Other Ambulatory Visit: Payer: Self-pay | Admitting: Family Medicine

## 2022-11-06 DIAGNOSIS — Z1212 Encounter for screening for malignant neoplasm of rectum: Secondary | ICD-10-CM

## 2022-11-06 DIAGNOSIS — Z1211 Encounter for screening for malignant neoplasm of colon: Secondary | ICD-10-CM

## 2022-11-26 ENCOUNTER — Encounter: Payer: Self-pay | Admitting: Podiatry

## 2022-11-26 ENCOUNTER — Ambulatory Visit: Payer: Medicare Other | Admitting: Podiatry

## 2022-11-26 VITALS — Ht 62.0 in | Wt 202.4 lb

## 2022-11-26 DIAGNOSIS — B351 Tinea unguium: Secondary | ICD-10-CM

## 2022-11-26 DIAGNOSIS — M79674 Pain in right toe(s): Secondary | ICD-10-CM

## 2022-11-26 DIAGNOSIS — M79675 Pain in left toe(s): Secondary | ICD-10-CM

## 2022-11-26 NOTE — Progress Notes (Signed)

## 2022-12-03 IMAGING — CR DG HIP (WITH OR WITHOUT PELVIS) 3-4V BILAT
3 series · 3 of 3 positions shown · non-contrast
Comparison: None.

CLINICAL DATA: Right sciatica

EXAM:
DG HIP (WITH OR WITHOUT PELVIS) 3-4V BILAT

[t pelvis ap]
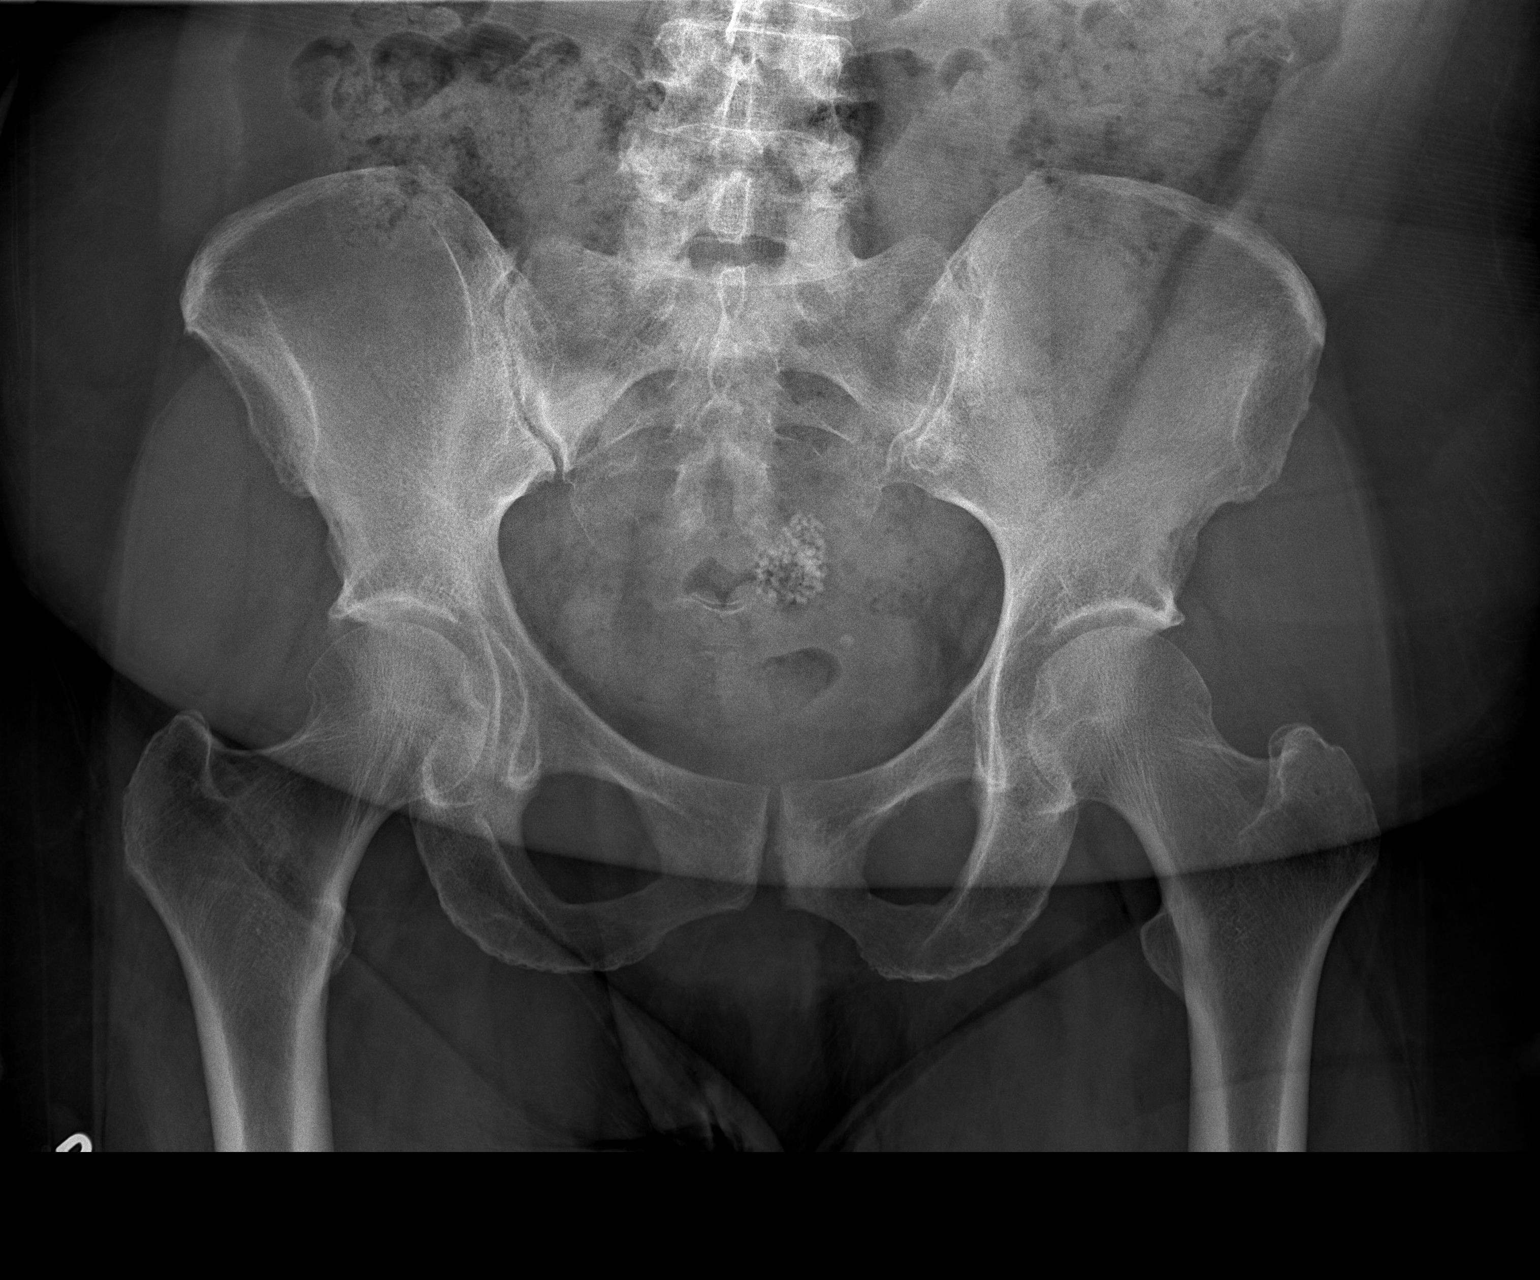

[t hip frog right]
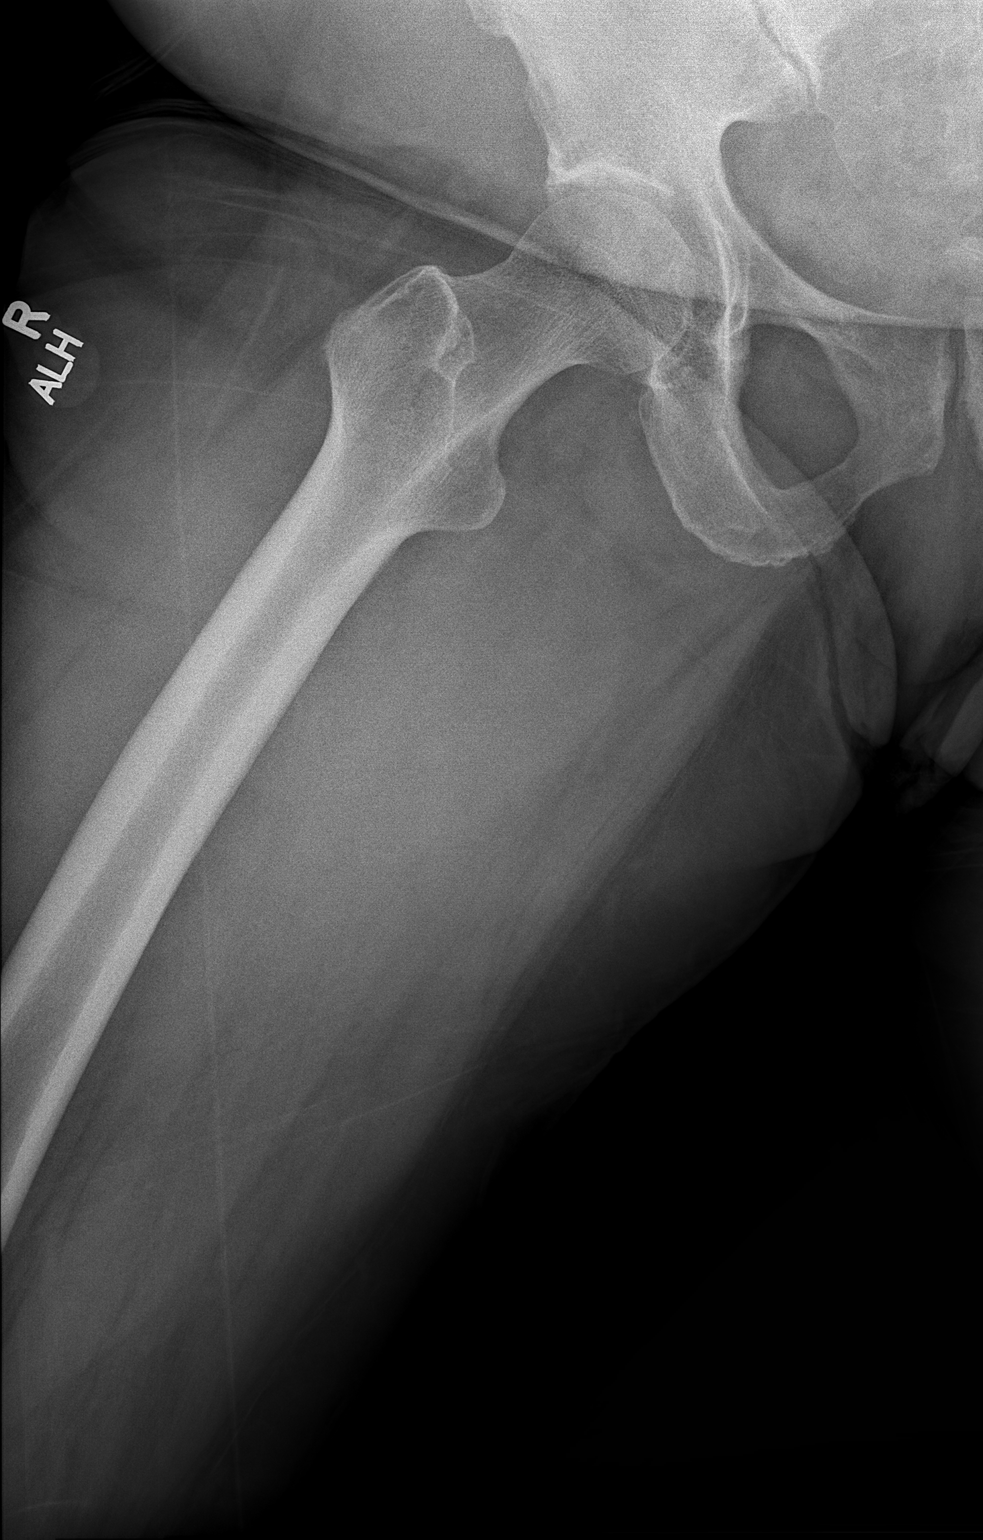

[t hip frog left]
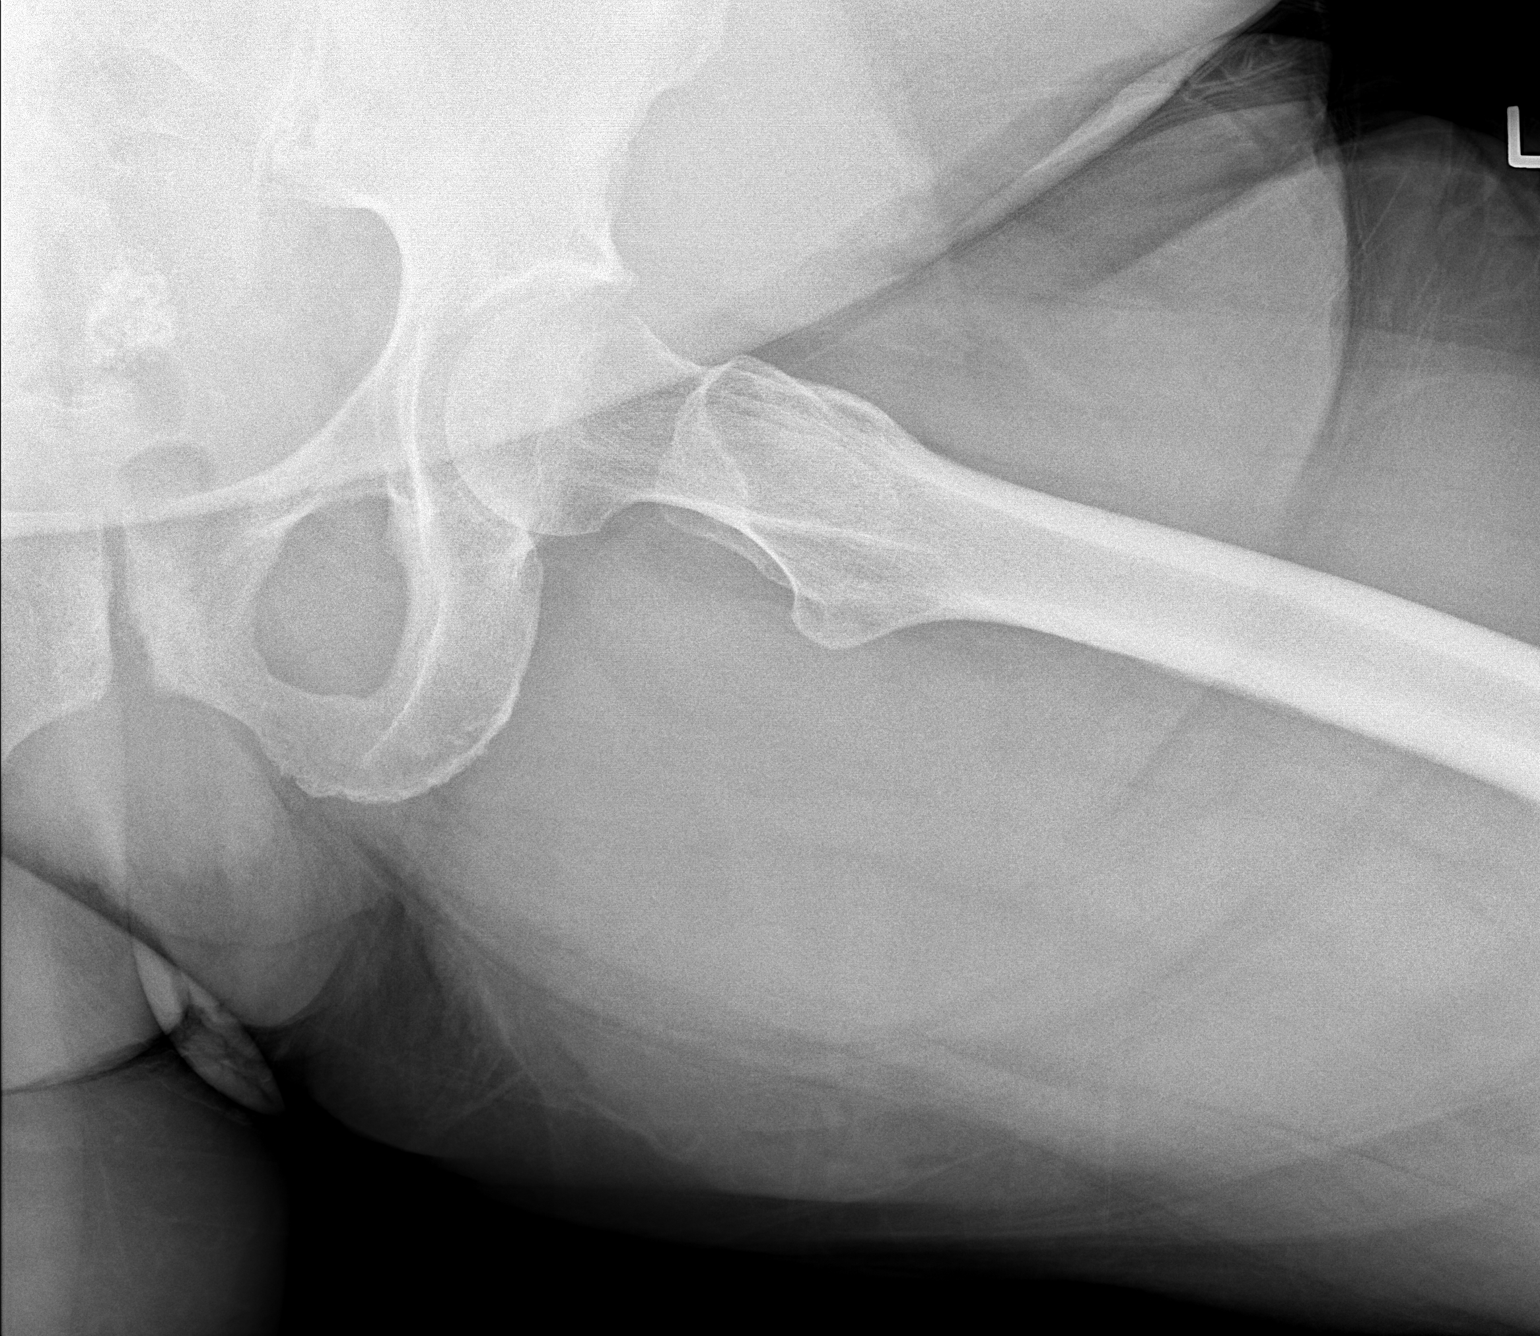

[3 of 3 positions shown; findings below may reference images not displayed]

FINDINGS: There is no evidence of hip fracture or dislocation. There is no
evidence of arthropathy or other focal bone abnormality. Involuted
fibroid within the pelvis.
IMPRESSION: Negative.

## 2022-12-03 IMAGING — CR DG LUMBAR SPINE COMPLETE 4+V
5 series · 5 of 5 positions shown · non-contrast
Comparison: 06/13/2008

CLINICAL DATA: Right-sided sciatica

EXAM:
LUMBAR SPINE - COMPLETE 4+ VIEW

[t lumbar spine ap]
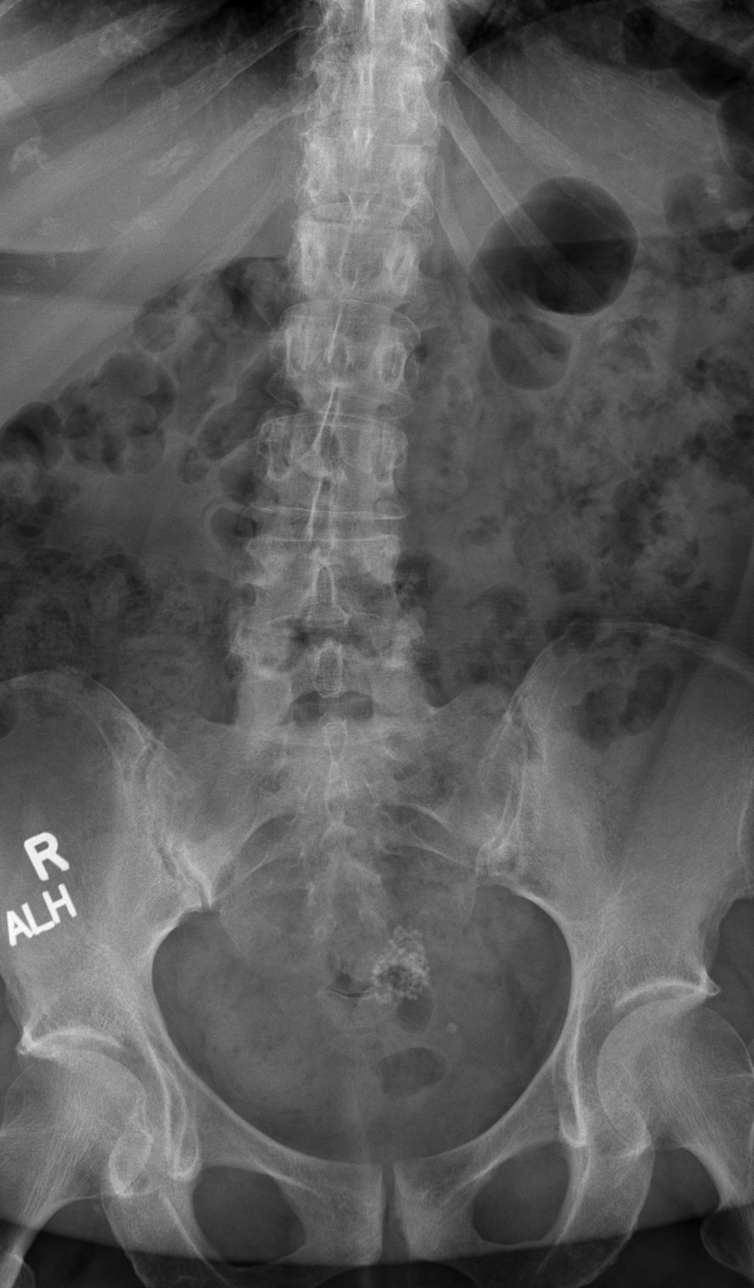

[t lumbar spine obl (1 of 2)]
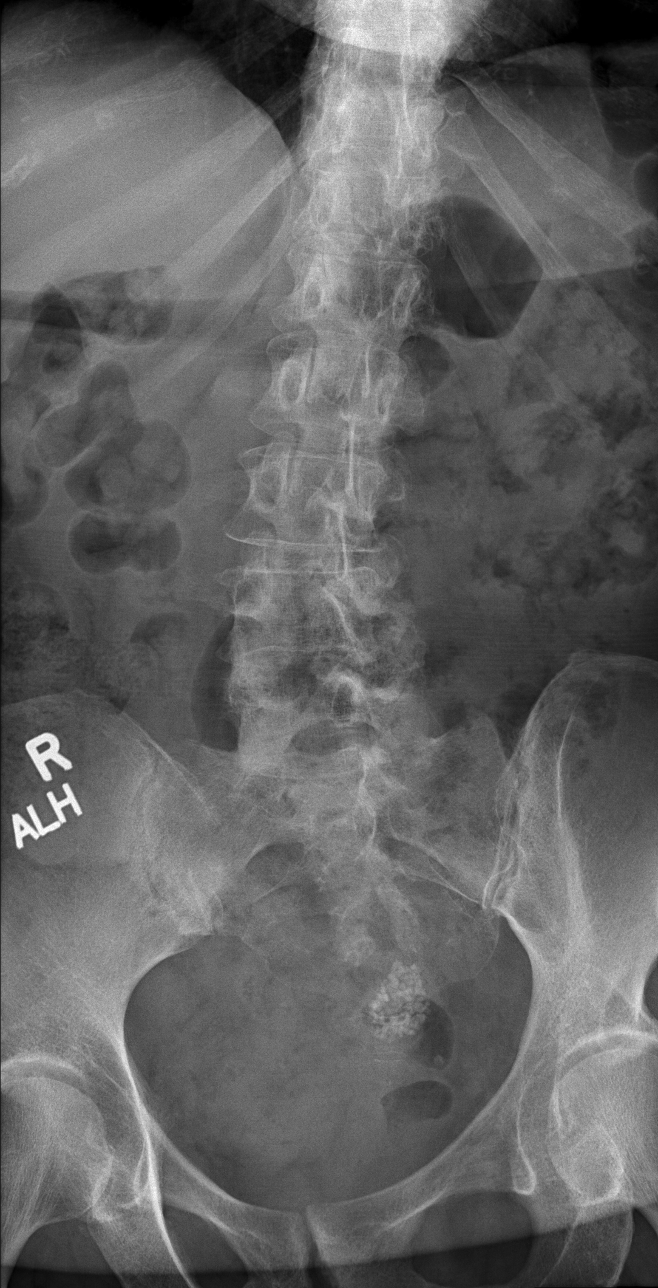

[t lumbar spine obl (2 of 2)]
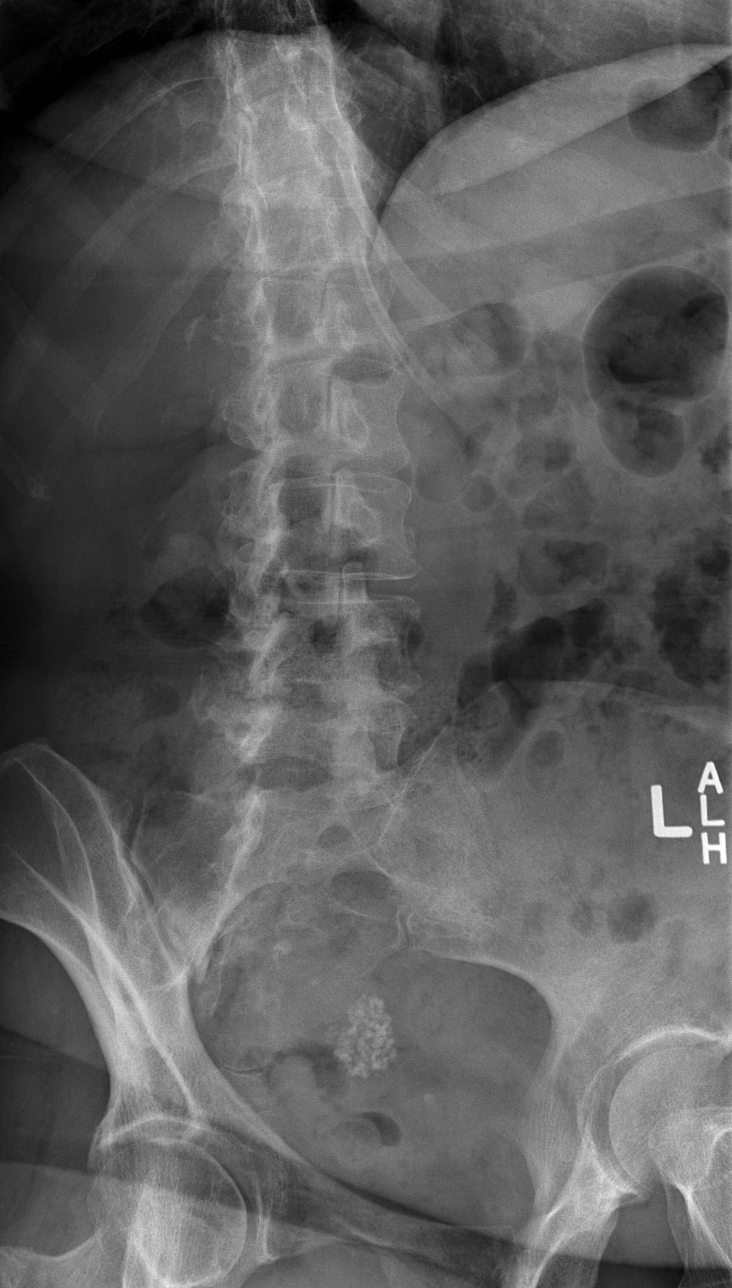

[t lumbar spine lat]
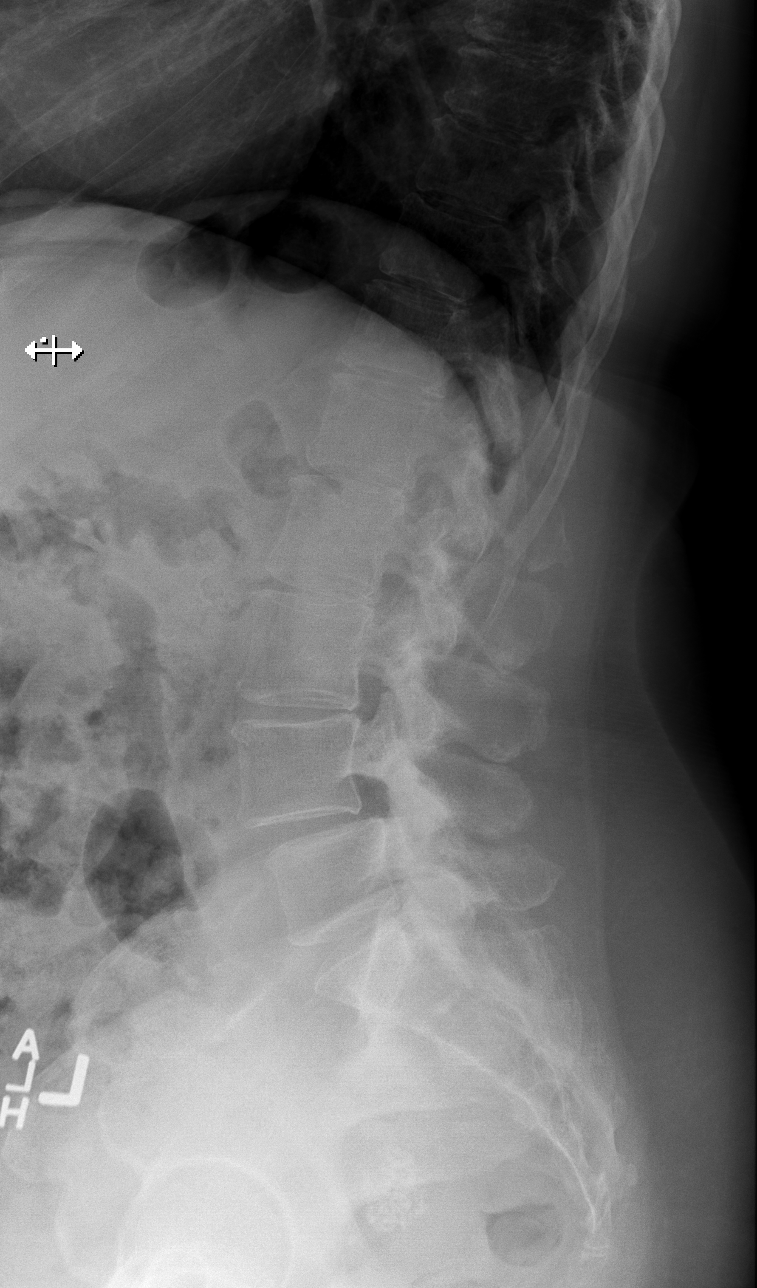

[t lumbar l-5 s-1 spot]
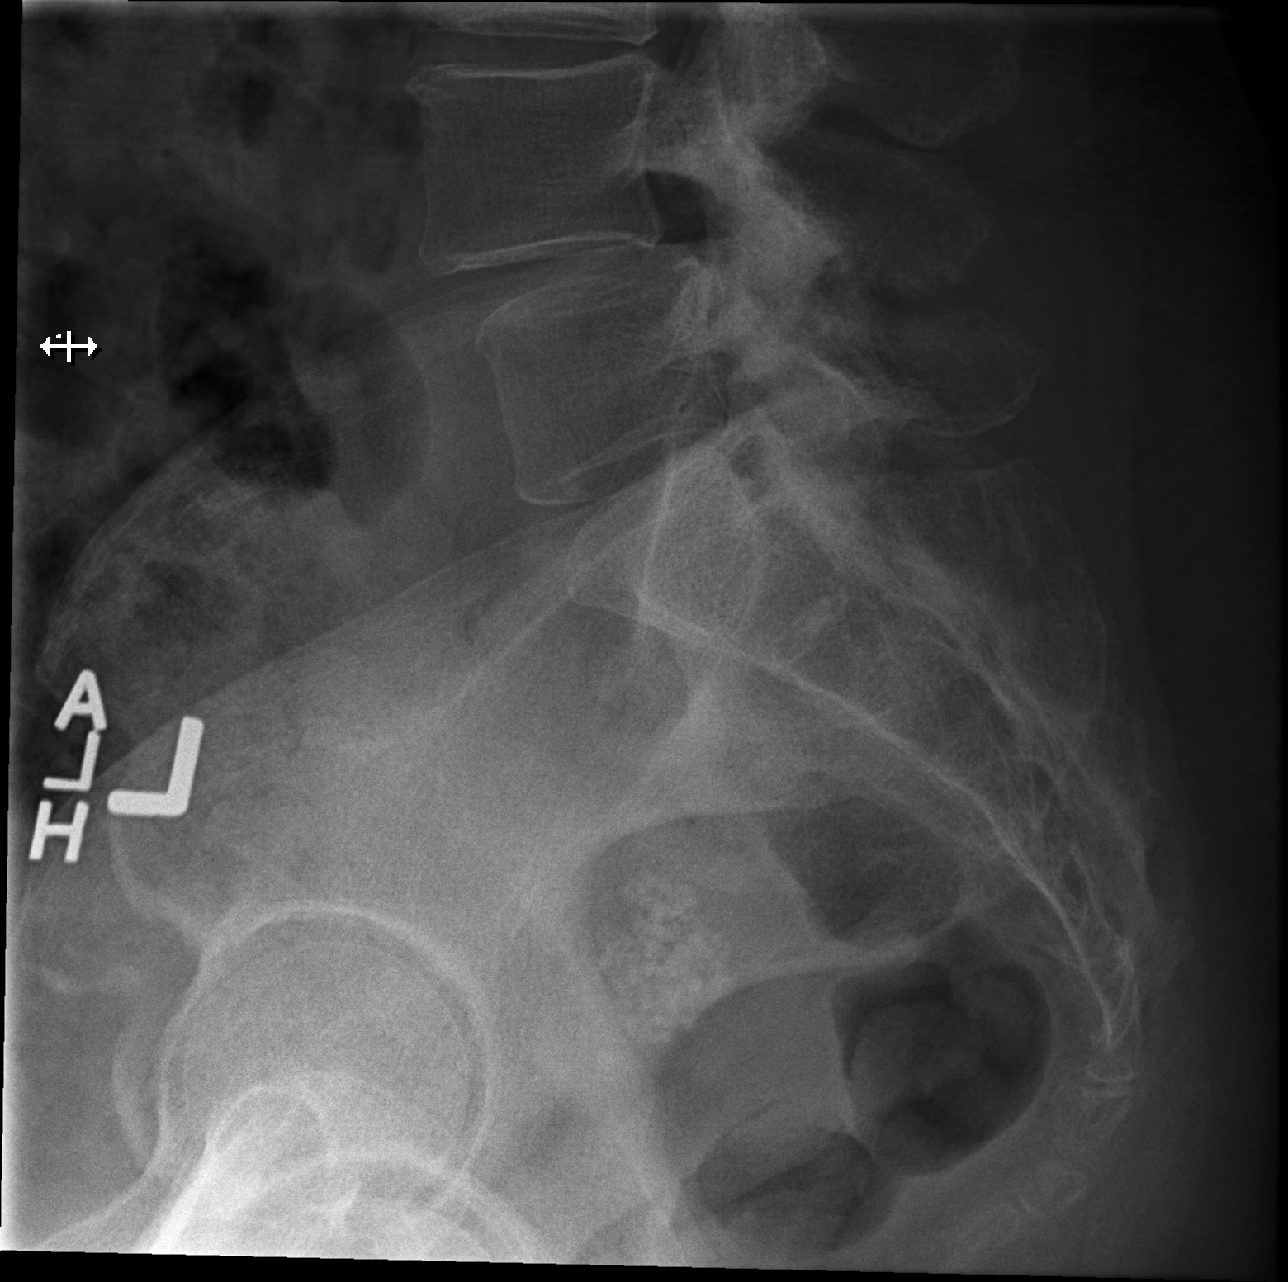

[5 of 5 positions shown; findings below may reference images not displayed]

FINDINGS: Normal lumbar lordosis. No acute fracture of the lumbar spine. Grade
1 anterolisthesis of L4-5 as developed with superimposed mild
intervertebral disc space narrowing in keeping with mild
degenerative disc disease. Vertebral body heights have been
preserved. Oblique views demonstrate no evidence of pars defect
though the L4 and L5 pars interarticularis are not optimally
profiled. Involuted fibroid within the pelvis. Paraspinal soft
tissues are unremarkable.
IMPRESSION: Interval development of mild degenerative disc disease L4-5 with
grade 1 anterolisthesis.

## 2023-02-25 ENCOUNTER — Ambulatory Visit: Payer: Medicare Other | Admitting: Podiatry

## 2023-03-08 ENCOUNTER — Ambulatory Visit: Payer: Medicare Other | Admitting: Podiatry

## 2023-03-08 ENCOUNTER — Encounter: Payer: Self-pay | Admitting: Podiatry

## 2023-03-08 VITALS — Ht 62.0 in | Wt 202.0 lb

## 2023-03-08 DIAGNOSIS — B351 Tinea unguium: Secondary | ICD-10-CM | POA: Diagnosis not present

## 2023-03-08 DIAGNOSIS — M79674 Pain in right toe(s): Secondary | ICD-10-CM

## 2023-03-08 DIAGNOSIS — M79675 Pain in left toe(s): Secondary | ICD-10-CM

## 2023-03-08 NOTE — Progress Notes (Signed)

## 2023-04-05 ENCOUNTER — Ambulatory Visit
Admission: EM | Admit: 2023-04-05 | Discharge: 2023-04-05 | Disposition: A | Discharge status: Home / Self Care | Attending: Family Medicine | Admitting: Family Medicine

## 2023-04-05 ENCOUNTER — Other Ambulatory Visit: Payer: Self-pay

## 2023-04-05 ENCOUNTER — Encounter: Payer: Self-pay | Admitting: *Deleted

## 2023-04-05 DIAGNOSIS — R82998 Other abnormal findings in urine: Secondary | ICD-10-CM | POA: Diagnosis present

## 2023-04-05 DIAGNOSIS — R051 Acute cough: Secondary | ICD-10-CM | POA: Diagnosis not present

## 2023-04-05 DIAGNOSIS — J019 Acute sinusitis, unspecified: Secondary | ICD-10-CM | POA: Insufficient documentation

## 2023-04-05 DIAGNOSIS — I10 Essential (primary) hypertension: Secondary | ICD-10-CM | POA: Insufficient documentation

## 2023-04-05 DIAGNOSIS — R8290 Unspecified abnormal findings in urine: Secondary | ICD-10-CM | POA: Insufficient documentation

## 2023-04-05 LAB — POCT URINALYSIS DIP (MANUAL ENTRY)
Bilirubin, UA: NEGATIVE
Blood, UA: NEGATIVE
Glucose, UA: NEGATIVE mg/dL
Ketones, POC UA: NEGATIVE mg/dL
Nitrite, UA: NEGATIVE
Protein Ur, POC: NEGATIVE mg/dL
Spec Grav, UA: 1.015 (ref 1.010–1.025)
Urobilinogen, UA: 0.2 U/dL
pH, UA: 5.5 (ref 5.0–8.0)

## 2023-04-05 MED ORDER — AMOXICILLIN 875 MG PO TABS: 875.0000 mg | ORAL_TABLET | Freq: Two times a day (BID) | ORAL | 0 refills | Status: AC

## 2023-04-05 MED ORDER — GUAIFENESIN ER 1200 MG PO TB12: 1.0000 | ORAL_TABLET | Freq: Two times a day (BID) | ORAL | 1 refills | Status: AC | PRN

## 2023-04-05 NOTE — Discharge Instructions (Addendum)
 Start amoxicillin 875 twice daily for 7 days for treatment of sinus infection.  For management of cough guanfacine 1200 mg tablets twice daily to take with a full cup of water this will break up mucus and help with cough.  Urine showed a small amount of bacteria.  Urine culture has been ordered if any treatment is needed after urine culture results in 2 to 3 days we will contact you to notify you of any additional treatment.  If symptoms do not improve follow-up with primary care doctor as needed. Your blood pressure is elevated recommend restarting your blood pressure medication.

## 2023-04-05 NOTE — ED Triage Notes (Signed)
 Cough and nasal congestion (yellow mucous)  x 5 days. Denies known fever.  Also would like to be checked for UTI- states she had Sx a month ago and wanted to make sure things had cleared up

## 2023-04-05 NOTE — ED Provider Notes (Signed)
 [facility]    CSN: [id number] Arrival date & time: 04/05/23  1458      History   Chief Complaint Chief Complaint  Patient presents with   Nasal Congestion    HPI Sheri Griffith is a 73 y.o. female.   Patient presents today for evaluation of nasal congestion, runny nose, postnasal drainage and cough present for 1 week.  Patient has not had fever or shortness of breath.  Patient has been using over-the-counter medication without improvement of symptoms.  Denies any overt headache pain but endorses some tenderness in the frontal sinuses. Patient reports sinus infections occasionally but has not had any within the last 2 years. Patient is also concerned about possible UTI. Patient endorses noticing her urine was dark for the last month.  Request to be checked for UTI.  Past Medical History:  Diagnosis Date   Arthritis of knee    Cataracts, bilateral    Hypertension    Sciatica    Sinus problem     Patient Active Problem List   Diagnosis Date Noted   Right sided sciatica 04/05/2022   Hair thinning 08/27/2021   Vitamin D deficiency 08/27/2021   Night sweats 08/27/2021   Neutropenia (HCC) 11/06/2020   Pain of cervical facet joint 06/15/2019   Sciatica of left side associated with disorder of lumbar spine 06/15/2019   Screening for osteoporosis 05/16/2019   Screening for colon cancer 05/16/2019   Encounter for screening mammogram for malignant neoplasm of breast 05/16/2019   Need for prophylactic vaccination against Streptococcus pneumoniae (pneumococcus) 05/16/2019   Uncontrolled hypertension 05/16/2019   Bursitis of hip 05/16/2019   Abdominal distension (gaseous) 09/04/2016   Generalized abdominal pain 09/04/2016   ANXIETY, CHRONIC 06/21/2007   GERD 06/21/2007   INTERNAL HEMORRHOIDS 02/07/2001    Past Surgical History:  Procedure Laterality Date   NONE      OB History   No obstetric history on file.      Home Medications    Prior to Admission  medications   Medication Sig Start Date End Date Taking? Authorizing Provider  amoxicillin (AMOXIL) 875 MG tablet Take 1 tablet (875 mg total) by mouth 2 (two) times daily for 7 days. 04/05/23 04/12/23 Yes Bing Neighbors, NP  Guaifenesin [facility] MAXIMUM STRENGTH) 1200 MG TB12 Take 1 tablet (1,200 mg total) by mouth every 12 (twelve) hours as needed (Cough). 04/05/23  Yes Bing Neighbors, NP  amLODipine (NORVASC) 2.5 MG tablet Take 1 tablet (2.5 mg total) by mouth daily. 05/08/22   Alveria Apley, NP  azelastine (ASTELIN) 0.1 % nasal spray Place 1 spray into both nostrils 2 (two) times daily. Use in each nostril as directed 04/08/20   Janeece Agee, NP  Cholecalciferol (VITAMIN D) 50 MCG (2000 UT) CAPS Take 1 capsule by mouth daily.    [provider]  cyclobenzaprine (FLEXERIL) 5 MG tablet Take 1 tablet (5 mg total) by mouth 3 (three) times daily as needed for muscle spasms. 04/03/22   Alveria Apley, NP  ibuprofen (ADVIL) 100 MG/5ML suspension Take 200 mg by mouth every 4 (four) hours as needed.    [provider]  mometasone (NASONEX) 50 MCG/ACT nasal spray Place 2 sprays into the nose daily. 09/04/21   Sheliah Hatch, MD    Family History Family History  Problem Relation Age of Onset   Other Mother        staph infection   Arthritis Father    Other Father  died age 30   Colon cancer Neg Hx    Rectal cancer Neg Hx    Esophageal cancer Neg Hx    Liver cancer Neg Hx     Social History Social History   Tobacco Use   Smoking status: Never   Smokeless tobacco: Never  Vaping Use   Vaping status: Never Used  Substance Use Topics   Alcohol use: Not Currently    Comment: rare   Drug use: No     Allergies   Procaine hcl, Cortisone, and Lidocaine   Review of Systems Review of Systems Pertinent negatives listed in HPI   Physical Exam Triage Vital Signs ED Triage Vitals  Encounter Vitals Group     BP 04/05/23 1634 (!) 166/92      Systolic BP Percentile --      Diastolic BP Percentile --      Pulse Rate 04/05/23 1634 71     Resp 04/05/23 1634 18     Temp 04/05/23 1634 98.1 F (36.7 C)     Temp Source 04/05/23 1634 Oral     SpO2 04/05/23 1634 95 %     Weight --      Height --      Head Circumference --      Peak Flow --      Pain Score 04/05/23 1630 0     Pain Loc --      Pain Education --      Exclude from Growth Chart --    No data found.  Updated Vital Signs BP (!) 166/92 (BP Location: Left Arm)   Pulse 71   Temp 98.1 F (36.7 C) (Oral)   Resp 18   SpO2 95%   Visual Acuity Right Eye Distance:   Left Eye Distance:   Bilateral Distance:    Right Eye Near:   Left Eye Near:    Bilateral Near:     Physical Exam Vitals reviewed.  Constitutional:      Appearance: Normal appearance.  HENT:     Head: Normocephalic and atraumatic.     Right Ear: External ear normal.     Left Ear: External ear normal.     Nose: Congestion and rhinorrhea present.     Mouth/Throat:     Pharynx: Oropharyngeal exudate and postnasal drip present. No posterior oropharyngeal erythema or uvula swelling.  Eyes:     Extraocular Movements: Extraocular movements intact.     Conjunctiva/sclera: Conjunctivae normal.     Pupils: Pupils are equal, round, and reactive to light.  Neck:     Vascular: No carotid bruit.  Cardiovascular:     Rate and Rhythm: Normal rate and regular rhythm.  Pulmonary:     Effort: Pulmonary effort is normal.     Breath sounds: Normal breath sounds.  Musculoskeletal:        General: Normal range of motion.     Cervical back: Normal range of motion.  Lymphadenopathy:     Cervical: No cervical adenopathy.  Skin:    General: Skin is warm.  Neurological:     General: No focal deficit present.     Mental Status: She is alert.  Psychiatric:        Behavior: Behavior is cooperative.      UC Treatments / Results  Labs (all labs ordered are listed, but only abnormal results are  displayed) Labs Reviewed  POCT URINALYSIS DIP (MANUAL ENTRY) - Abnormal; Notable for the following components:      Result  Value   Leukocytes, UA Trace (*)    All other components within normal limits  URINE CULTURE    EKG   Radiology No results found.  Procedures Procedures (including critical care time)  Medications Ordered in UC Medications - No data to display  Initial Impression / Assessment and Plan / UC Course  I have reviewed the triage vital signs and the nursing notes.  Pertinent labs & imaging results that were available during my care of the patient were reviewed by me and considered in my medical decision making (see chart for details).    Acute nonrecurrent sinusitis treatment with amoxicillin 875 twice daily for 7 days.  UA obtained has small leuks will obtain a urine culture to rule out infection as patient is asymptomatic will not prescribe any additional treatment today.  Cough Mucinex 1200 mg tablets twice daily as needed.  Gust at length patient has elevated blood pressure she took 1 prescription of amlodipine and has not taken any hypertensive medicines for over a year.  Advised to resume blood pressure medications as blood pressure readings this elevated lead to stroke or other morbid conditions.  Advised patient to resume blood pressure medicine and follow-up with primary care doctor. Final Clinical Impressions(s) / UC Diagnoses   Final diagnoses:  Dark urine  Abnormal finding in urine  Acute non-recurrent sinusitis, unspecified location  Acute cough  Elevated blood pressure reading in office with diagnosis of hypertension     Discharge Instructions      Start amoxicillin 875 twice daily for 7 days for treatment of sinus infection.  For management of cough guanfacine 1200 mg tablets twice daily to take with a full cup of water this will break up mucus and help with cough.  Urine showed a small amount of bacteria.  Urine culture has been ordered if any  treatment is needed after urine culture results in 2 to 3 days we will contact you to notify you of any additional treatment.  If symptoms do not improve follow-up with primary care doctor as needed. Your blood pressure is elevated recommend restarting your blood pressure medication.     ED Prescriptions     Medication Sig Dispense Auth. Provider   Guaifenesin [facility] MAXIMUM STRENGTH) 1200 MG TB12 Take 1 tablet (1,200 mg total) by mouth every 12 (twelve) hours as needed (Cough). 14 tablet Bing Neighbors, NP   amoxicillin (AMOXIL) 875 MG tablet Take 1 tablet (875 mg total) by mouth 2 (two) times daily for 7 days. 14 tablet Bing Neighbors, NP      PDMP not reviewed this encounter.   Bing Neighbors, NP 04/05/23 [phone]

## 2023-04-06 ENCOUNTER — Ambulatory Visit: Admitting: Family Medicine

## 2023-04-07 LAB — URINE CULTURE
Culture: NO GROWTH
Special Requests: NORMAL

## 2023-05-03 ENCOUNTER — Ambulatory Visit: Payer: Self-pay

## 2023-05-03 ENCOUNTER — Ambulatory Visit (INDEPENDENT_AMBULATORY_CARE_PROVIDER_SITE_OTHER): Admitting: Family Medicine

## 2023-05-03 VITALS — BP 130/72 | HR 75 | Temp 98.4°F | Ht 62.0 in | Wt 200.0 lb

## 2023-05-03 DIAGNOSIS — J301 Allergic rhinitis due to pollen: Secondary | ICD-10-CM | POA: Diagnosis not present

## 2023-05-03 DIAGNOSIS — R051 Acute cough: Secondary | ICD-10-CM | POA: Diagnosis not present

## 2023-05-03 LAB — POC COVID19 BINAXNOW: SARS Coronavirus 2 Ag: NEGATIVE

## 2023-05-03 LAB — POCT INFLUENZA A/B
Influenza A, POC: NEGATIVE
Influenza B, POC: NEGATIVE

## 2023-05-03 MED ORDER — PROMETHAZINE-DM 6.25-15 MG/5ML PO SYRP: 5.0000 mL | ORAL_SOLUTION | Freq: Four times a day (QID) | ORAL | 0 refills | Status: AC | PRN

## 2023-05-03 MED ORDER — CETIRIZINE HCL 5 MG/5ML PO SOLN: 10.0000 mg | Freq: Every day | ORAL | 2 refills | Status: AC

## 2023-05-03 MED ORDER — AZELASTINE HCL 0.1 % NA SOLN: 1.0000 | Freq: Two times a day (BID) | NASAL | 12 refills | Status: AC

## 2023-05-03 NOTE — Telephone Encounter (Signed)
  Chief Complaint: cough Symptoms: cough, wheezing, nasal congestion Frequency: x 2 days Pertinent Negatives: Patient denies fever or sob Disposition: [] ED /[] Urgent Care (no appt availability in office) / [x] Appointment(In office/virtual)/ []  [facility]/ [x] Home Care/ [] Refused Recommended Disposition /[] [facility]/ []  Follow-up with PCP Additional Notes: Patient states that she has been having a cough that is productive of yellow-brown sputum for 2 days, also some nasal congestion and wheezing.  States she has taking mucinex. States that she thinks it started with the pollen on her car. States that she didn't get much sleep last night as she was up coughing.    Copied From CRM [id number]. Reason for Triage: Patient experiencing a lot of congestion and coughing that prevents her from sleeping - had sinus infection 3 weeks ago     Reason for Disposition  Wheezing is present  Answer Assessment - Initial Assessment Questions 1. ONSET: "When did the cough begin?"      2 days 2. SEVERITY: "How bad is the cough today?"      bad 3. SPUTUM: "Describe the color of your sputum" (none, dry cough; clear, white, yellow, green)     Yellow-brown 4. HEMOPTYSIS: "Are you coughing up any blood?" If so ask: "How much?" (flecks, streaks, tablespoons, etc.)     no 5. DIFFICULTY BREATHING: "Are you having difficulty breathing?" If Yes, ask: "How bad is it?" (e.g., mild, moderate, severe)    - MILD: No SOB at rest, mild SOB with walking, speaks normally in sentences, can lie down, no retractions, pulse < 100.    - MODERATE: SOB at rest, SOB with minimal exertion and prefers to sit, cannot lie down flat, speaks in phrases, mild retractions, audible wheezing, pulse 100-120.    - SEVERE: Very SOB at rest, speaks in single words, struggling to breathe, sitting hunched forward, retractions, pulse > 120      no 6. FEVER: "Do you have a fever?" If Yes, ask: "What is your temperature, how was  it measured, and when did it start?"     no 7. CARDIAC HISTORY: "Do you have any history of heart disease?" (e.g., heart attack, congestive heart failure)      no 8. LUNG HISTORY: "Do you have any history of lung disease?"  (e.g., pulmonary embolus, asthma, emphysema)     no 9. PE RISK FACTORS: "Do you have a history of blood clots?" (or: recent major surgery, recent prolonged travel, bedridden)     no 10. OTHER SYMPTOMS: "Do you have any other symptoms?" (e.g., runny nose, wheezing, chest pain)       Nasal congestion, sneezing, wheezing  Protocols used: Cough - Acute Productive-A-AH

## 2023-05-03 NOTE — Progress Notes (Signed)
 Acute Office Visit   Subjective:  Patient ID: Sheri Griffith, female    DOB: 1950-02-12, 73 y.o.   MRN: [id number]  Chief Complaint  Patient presents with   Cough   Nasal Congestion    HPI:  Patient is here for an acute visit. She is experiencing a productive cough. Sputum is light green, varies between thick and thin. This started 5 days ago. Also, experiencing a slight wheezing, post nasal drip, and nasal congestion.   Denies fever, CP, SHOB, sore throat, ear pain/drainage, headache, nausea, or vomiting.   She has been taking Mucinex for the last 2 days, but helping a little. Last night, the cough was worse and kept her up at night.   Review of Systems  Respiratory:  Positive for cough.    See HPI above      Objective:   BP 130/72   Pulse 75   Temp 98.4 F (36.9 C) (Oral)   Ht 5\' 2"  (1.575 m)   Wt 200 lb (90.7 kg)   SpO2 97%   BMI 36.58 kg/m    Physical Exam Vitals reviewed.  Constitutional:      General: She is not in acute distress.    Appearance: Normal appearance. She is obese. She is not ill-appearing, toxic-appearing or diaphoretic.  HENT:     Head: Normocephalic and atraumatic.     Right Ear: External ear normal.     Left Ear: There is impacted cerumen.     Ears:     Comments: Moderate amount of cerumen in ear canal.     Nose:     Right Sinus: No maxillary sinus tenderness or frontal sinus tenderness.     Left Sinus: No maxillary sinus tenderness or frontal sinus tenderness.     Mouth/Throat:     Pharynx: Oropharynx is clear. Uvula midline. No pharyngeal swelling, oropharyngeal exudate, posterior oropharyngeal erythema or uvula swelling.  Eyes:     General:        Right eye: No discharge.        Left eye: No discharge.     Conjunctiva/sclera: Conjunctivae normal.  Cardiovascular:     Rate and Rhythm: Normal rate and regular rhythm.     Heart sounds: Normal heart sounds. No murmur heard.    No friction rub. No gallop.  Pulmonary:     Effort:  Pulmonary effort is normal. No respiratory distress.     Breath sounds: Normal breath sounds.  Musculoskeletal:        General: Normal range of motion.  Skin:    General: Skin is warm and dry.  Neurological:     General: No focal deficit present.     Mental Status: She is alert and oriented to person, place, and time. Mental status is at baseline.  Psychiatric:        Mood and Affect: Mood normal.        Behavior: Behavior normal.        Thought Content: Thought content normal.        Judgment: Judgment normal.    Results for orders placed or performed in visit on 05/03/23  POC COVID-19  Result Value Ref Range   SARS Coronavirus 2 Ag Negative Negative  POC Influenza A/B  Result Value Ref Range   Influenza A, POC Negative Negative   Influenza B, POC Negative Negative      Assessment & Plan:  Acute cough -     Promethazine-DM; Take 5 mLs by mouth 4 (  four) times daily as needed for up to 7 days.  Dispense: 118 mL; Refill: 0 -     POC COVID-19 BinaxNow -     POCT Influenza A/B  Seasonal allergic rhinitis due to pollen -     Azelastine HCl; Place 1 spray into both nostrils 2 (two) times daily. Use in each nostril as directed  Dispense: 30 mL; Refill: 12 -     Cetirizine HCl; Take 10 mLs (10 mg total) by mouth daily.  Dispense: 300 mL; Refill: 2  -Rapid negative covid and influenza.  -Symptoms seem related to seasonal allergies and pollen. Refilled Azelastine 1 spray in both nostrils twice a day.  -Prescribed Cetirizine daily at bedtime.  -Prescribed Promethazine-DM every 6 hours as needed for cough.  -Left ear is impacted and right ear is nearly impacted. Will need to make a follow up appointment for ear lavage.   Return in about 1 week (around 05/10/2023) for follow-up:Ear lavage .  Zandra Abts, NP

## 2023-05-03 NOTE — Patient Instructions (Addendum)
-  Rapid negative covid and influenza.  -Symptoms seem related to seasonal allergies and pollen. Refilled Azelastine 1 spray in both nostrils twice a day.  -Prescribed Cetirizine daily at bedtime.  -Prescribed Promethazine-DM every 6 hours as needed for cough.  -Left ear is impacted and right ear is nearly impacted. Will need to make a follow up appointment.  -Follow up if not improved.

## 2023-05-06 ENCOUNTER — Telehealth: Payer: Self-pay | Admitting: *Deleted

## 2023-05-06 NOTE — Telephone Encounter (Signed)
 Copied from CRM [id number]. Topic: Clinical - Medication Question >> May 06, 2023 10:08 AM Florestine Avers wrote: Reason for CRM: Patient is requesting a call back from the nurse to go over her medications that they were discussing via mychart messages. She states she rather speak to someone directly in the office,. Please call patient back.

## 2023-05-06 NOTE — Telephone Encounter (Signed)
 Attempted to call pt 3 different time left a vm for pt how to take her medication and if she had any questions to give to office a call back

## 2023-05-06 NOTE — Telephone Encounter (Signed)
 Copied from CRM [id number]. Topic: General - Other >> May 06, 2023  8:04 AM Truddie Crumble wrote: Reason for CRM: patient called stating she is still coughing after being given an medication from the doctor. Patient was given promethazine-dextromethorphan and cetirizine HCl (ZYRTEC) 5 MG/5ML SOLN. Patient stated the cough syrup is not working and she needs clarification on the directions for  zyrtec. Patient has not had any sleep and she still has congestion in her  CB [phone]

## 2023-05-07 ENCOUNTER — Other Ambulatory Visit: Payer: Self-pay | Admitting: Family Medicine

## 2023-05-07 ENCOUNTER — Telehealth: Payer: Self-pay

## 2023-05-07 ENCOUNTER — Other Ambulatory Visit: Payer: Self-pay

## 2023-05-07 DIAGNOSIS — R051 Acute cough: Secondary | ICD-10-CM

## 2023-05-07 MED ORDER — BENZONATATE 200 MG PO CAPS: 200.0000 mg | ORAL_CAPSULE | Freq: Three times a day (TID) | ORAL | 0 refills | Status: AC | PRN

## 2023-05-07 NOTE — Telephone Encounter (Signed)
 Copied from CRM [id number]. Topic: Clinical - Prescription Issue >> May 06, 2023  5:27 PM Eunice Blase wrote: Reason for CRM:Pt called and would like Benzonatate 200mg  capsule, take 1 capsule TID PRN as needed for cough, called into pharmacy. Please call pt [phone] when sent.

## 2023-05-07 NOTE — Telephone Encounter (Signed)
 Medication sent to pharmacy and called pt to let her know

## 2023-05-11 ENCOUNTER — Ambulatory Visit: Admitting: Family Medicine

## 2023-05-13 ENCOUNTER — Encounter: Payer: Self-pay | Admitting: Family Medicine

## 2023-05-13 ENCOUNTER — Ambulatory Visit (INDEPENDENT_AMBULATORY_CARE_PROVIDER_SITE_OTHER): Admitting: Family Medicine

## 2023-05-13 VITALS — BP 128/80 | HR 73 | Temp 98.3°F | Wt 202.0 lb

## 2023-05-13 DIAGNOSIS — H6123 Impacted cerumen, bilateral: Secondary | ICD-10-CM | POA: Diagnosis not present

## 2023-05-13 NOTE — Progress Notes (Signed)
   Established Patient Office Visit   Subjective:  Patient ID: Sheri Griffith, female    DOB: 1951-01-03  Age: 73 y.o. MRN: [id number]  Chief Complaint  Patient presents with   Ear Fullness    HPI Patient is here for bilateral ear impaction. Decreased hearing and ears feel full. She needs an ear lavage.  ROS See HPI above     Objective:   BP 128/80   Pulse 73   Temp 98.3 F (36.8 C) (Oral)   Wt 202 lb (91.6 kg)   SpO2 96%   BMI 36.95 kg/m    Physical Exam Vitals reviewed.  Constitutional:      General: She is not in acute distress.    Appearance: Normal appearance. She is not ill-appearing, toxic-appearing or diaphoretic.  HENT:     Right Ear: There is impacted cerumen.     Left Ear: There is impacted cerumen.  Eyes:     General:        Right eye: No discharge.        Left eye: No discharge.     Conjunctiva/sclera: Conjunctivae normal.  Cardiovascular:     Rate and Rhythm: Normal rate and regular rhythm.  Pulmonary:     Effort: Pulmonary effort is normal. No respiratory distress.  Musculoskeletal:        General: Normal range of motion.  Skin:    General: Skin is warm and dry.  Neurological:     General: No focal deficit present.     Mental Status: She is alert and oriented to person, place, and time. Mental status is at baseline.  Psychiatric:        Mood and Affect: Mood normal.        Behavior: Behavior normal.        Thought Content: Thought content normal.        Judgment: Judgment normal.   PRE-PROCEDURE EXAM: Left and Right TM cannot be visualized due to total occlusion/impaction of the ear canal. PROCEDURE INDICATION: Remove wax to visualize ear drum & relieve discomfort CONSENT:  Verbalto  PROCEDURE NOTE: Left and Right ear: The CMA, Kyleigh Sigmon, irrigated both ears with warm water and ear drops to remove the wax. 100% of the wax was removed.  POST- PROCEDURE EXAM: TMs successfully visualized. TM with no erythema. The patient tolerated the  procedure well.      Assessment & Plan:  Hearing loss of both ears due to cerumen impaction -     Ear Lavage  -Ear lavage completed on both ears for impaction. Successful. -Recommend to use over the counter Debrox every 3-6 months as needed to remove ear wax if you start to have hard of hearing. Follow up for ear lavage if unsuccessful.   Zandra Abts, NP

## 2023-05-13 NOTE — Patient Instructions (Signed)
-  Ear lavage completed on both ears for impaction. Successful. -Recommend to use over the counter Debrox every 3-6 months as needed to remove ear wax if you start to have hard of hearing. Follow up for ear lavage if unsuccessful.

## 2023-06-07 ENCOUNTER — Ambulatory Visit: Payer: Medicare Other | Admitting: Podiatry

## 2023-07-20 ENCOUNTER — Ambulatory Visit: Admitting: Podiatry

## 2023-07-20 ENCOUNTER — Encounter: Payer: Self-pay | Admitting: Podiatry

## 2023-07-20 DIAGNOSIS — M79674 Pain in right toe(s): Secondary | ICD-10-CM

## 2023-07-20 DIAGNOSIS — M79675 Pain in left toe(s): Secondary | ICD-10-CM

## 2023-07-20 DIAGNOSIS — B351 Tinea unguium: Secondary | ICD-10-CM

## 2023-07-20 NOTE — Progress Notes (Signed)

## 2023-08-20 ENCOUNTER — Encounter: Payer: Self-pay | Admitting: Advanced Practice Midwife

## 2023-10-20 ENCOUNTER — Ambulatory Visit: Admitting: Podiatry

## 2023-10-27 ENCOUNTER — Encounter: Payer: Self-pay | Admitting: Family Medicine

## 2023-10-27 ENCOUNTER — Ambulatory Visit (INDEPENDENT_AMBULATORY_CARE_PROVIDER_SITE_OTHER): Admitting: Family Medicine

## 2023-10-27 VITALS — BP 126/80 | HR 72 | Temp 98.0°F | Wt 195.0 lb

## 2023-10-27 DIAGNOSIS — J019 Acute sinusitis, unspecified: Secondary | ICD-10-CM

## 2023-10-27 MED ORDER — AZITHROMYCIN 250 MG PO TABS: ORAL_TABLET | ORAL | 0 refills | Status: AC

## 2023-10-27 NOTE — Progress Notes (Signed)
   Subjective:    Patient ID: Sharman SHAUNNA Gull, female    DOB: [birth year], 73 y.o.   MRN: [id number]  HPI Here for 4 days of sinus congestion, PND, and coughing up yellow sputum. No fever or SOB. Taking Mucinex .    Review of Systems  Constitutional: Negative.   HENT:  Positive for congestion, postnasal drip and sinus pressure. Negative for ear pain and sore throat.   Eyes: Negative.   Respiratory: Negative.         Objective:   Physical Exam Constitutional:      Appearance: Normal appearance.  HENT:     Right Ear: Tympanic membrane, ear canal and external ear normal.     Left Ear: Tympanic membrane, ear canal and external ear normal.     Nose: Nose normal.     Mouth/Throat:     Pharynx: Oropharynx is clear.  Eyes:     Conjunctiva/sclera: Conjunctivae normal.  Pulmonary:     Effort: Pulmonary effort is normal.     Breath sounds: Normal breath sounds.  Lymphadenopathy:     Cervical: No cervical adenopathy.  Neurological:     Mental Status: She is alert.           Assessment & Plan:  Sinusitis, treat with a Zpack. Garnette Olmsted, MD

## 2023-10-29 ENCOUNTER — Ambulatory Visit

## 2023-11-01 ENCOUNTER — Telehealth: Payer: Self-pay | Admitting: *Deleted

## 2023-11-01 NOTE — Telephone Encounter (Signed)
 Copied from CRM [id number]. Topic: Clinical - Medication Question >> Nov 01, 2023  4:11 PM Jasmin G wrote: Reason for CRM: Pt states that she started taking a med prescribed recently and has been experiencing coughing, stuffy nose and gurgling sounds at night, she requested a call back from one of Ms. Williamson's nurses to discuss what would be the best thing to do about this. Call pt back at [phone].

## 2023-11-03 ENCOUNTER — Telehealth: Payer: Self-pay | Admitting: Family Medicine

## 2023-11-03 DIAGNOSIS — R059 Cough, unspecified: Secondary | ICD-10-CM

## 2023-11-03 NOTE — Telephone Encounter (Signed)
 Attempted to call pt to schedule an appointment lvm for pt to give a call back.

## 2023-11-03 NOTE — Telephone Encounter (Signed)
 Pt came by office today stating she would like something sent in for cough. She stated possibly a cough syrup. Would also like call from nurse when possible.

## 2023-11-04 ENCOUNTER — Ambulatory Visit: Payer: Self-pay

## 2023-11-04 MED ORDER — BENZONATATE 200 MG PO CAPS
200.0000 mg | ORAL_CAPSULE | Freq: Three times a day (TID) | ORAL | 0 refills | Status: AC | PRN
Start: 2023-11-04 — End: ?

## 2023-11-04 NOTE — Telephone Encounter (Signed)
 Lvm for pt and sent medication into pharmacy

## 2023-11-04 NOTE — Telephone Encounter (Signed)
 Sent medication into pharmacy.

## 2023-11-04 NOTE — Telephone Encounter (Signed)
 FYI Only or Action Required?: Action required by provider: clinical question for provider.  Patient was last seen in primary care on 10/27/2023 by Sheri Garnette LABOR, MD.  Called Nurse Triage reporting Advice Only.   Triage Disposition: Call PCP When Office is Open  Patient/caregiver understands and will follow disposition?: Yes      Copied from CRM [id number]. Topic: Clinical - Red Word Triage >> Nov 04, 2023  2:19 PM Larissa S wrote: [facility] that prompted transfer to Nurse Triage: severe cough Reason for Disposition  [1] Caller requesting NON-URGENT health information AND [2] PCP's office is the best resource  Answer Assessment - Initial Assessment Questions 1. REASON FOR CALL: What is the main reason for your call? or How can I best help you?   Completed zpak on Sunday - pt stated as of today Yellowish/ brown color mucous pt is blowing out of nose, pt states sinus area/nose still stuffy.  Pt wants to know if she needs an additional ABX, or OTC medication to help.. Pt was also following up on tessalon  pearls that were to be called in for her today.  Please call pt to provide an update on this message.  Protocols used: Information Only Call - No Triage-A-AH

## 2023-11-24 ENCOUNTER — Encounter: Payer: Self-pay | Admitting: Podiatry

## 2023-11-24 ENCOUNTER — Ambulatory Visit: Admitting: Podiatry

## 2023-11-24 DIAGNOSIS — M79675 Pain in left toe(s): Secondary | ICD-10-CM

## 2023-11-24 DIAGNOSIS — M79674 Pain in right toe(s): Secondary | ICD-10-CM | POA: Diagnosis not present

## 2023-11-24 DIAGNOSIS — B351 Tinea unguium: Secondary | ICD-10-CM

## 2023-11-24 NOTE — Progress Notes (Signed)

## 2024-02-08 LAB — HM MAMMOGRAPHY

## 2024-02-09 ENCOUNTER — Ambulatory Visit: Payer: Self-pay | Admitting: Family Medicine

## 2024-02-09 ENCOUNTER — Encounter: Payer: Self-pay | Admitting: Family Medicine

## 2024-02-24 ENCOUNTER — Ambulatory Visit: Admitting: Podiatry

## 2024-03-03 ENCOUNTER — Ambulatory Visit

## 2024-03-03 ENCOUNTER — Ambulatory Visit (INDEPENDENT_AMBULATORY_CARE_PROVIDER_SITE_OTHER): Admitting: Family Medicine

## 2024-03-03 ENCOUNTER — Encounter: Payer: Self-pay | Admitting: Family Medicine

## 2024-03-03 VITALS — BP 130/74 | HR 66 | Temp 98.1°F | Ht 62.0 in | Wt 197.0 lb

## 2024-03-03 DIAGNOSIS — H1131 Conjunctival hemorrhage, right eye: Secondary | ICD-10-CM | POA: Diagnosis not present

## 2024-03-03 NOTE — Progress Notes (Signed)
" ° °  Established Patient Office Visit   Subjective:  Patient ID: Sheri Griffith, female    DOB: 11-24-1950  Age: 74 y.o. MRN: [id number]  Chief Complaint  Patient presents with   Eye Problem    Right eye red   [name] presents to the clinic due to what she believes is a popped blood vessel in her right eye. Symptoms started this morning. Right eye is visibly red. Denies any changes in vision, pain, itchiness, headache, or blurriness.  HPI Review of Systems  Constitutional: Negative.   HENT: Negative.    Eyes:  Positive for redness.  Respiratory: Negative.    Cardiovascular: Negative.   Gastrointestinal: Negative.   Genitourinary: Negative.   Musculoskeletal: Negative.   Skin: Negative.   Neurological: Negative.   Psychiatric/Behavioral: Negative.      Objective:    BP 130/74   Pulse 66   Temp 98.1 F (36.7 C) (Oral)   Ht 5' 2 (1.575 m)   Wt 89.4 kg   SpO2 98%   BMI 36.03 kg/m    Physical Exam Constitutional:      Appearance: Normal appearance. She is obese.  HENT:     Head: Normocephalic.     Nose: Nose normal.     Mouth/Throat:     Mouth: Mucous membranes are moist.     Pharynx: Oropharynx is clear.  Eyes:     General:        Right eye: No discharge.     Extraocular Movements: Extraocular movements intact.     Pupils: Pupils are equal, round, and reactive to light.      Comments: Redness noted on Right Eye  Pulmonary:     Effort: Pulmonary effort is normal.  Neurological:     General: No focal deficit present.     Mental Status: She is alert and oriented to person, place, and time. Mental status is at baseline.  Psychiatric:        Mood and Affect: Mood normal.        Behavior: Behavior normal.        Thought Content: Thought content normal.        Judgment: Judgment normal.   No results found for any visits on 03/03/24.  The 10-year ASCVD risk score (Arnett DK, et al., 2019) is: 12.6%   Assessment & Plan:  Burst blood vessel in eye,  right Assessment & Plan: Can use warm or cold compresses 4x a day for 20 minutes at a time for comfort. If symptoms appear over the weekend- vision changes, blurriness, headache- go to the nearest Emergency Room. If redness is still apparent within the beginning of the week and has not changed, recommend to contact your Eye doctor.    -Discussed most likely cause of burst blood vessel. These usually heal on their own in 1- 2 weeks.  -Can use warm or cold compresses 4x a day for 20 minutes at a time for comfort. -If symptoms appear over the weekend- vision changes, blurriness, headache- go to the nearest Emergency Room. -If redness is still apparent within the beginning of the week and has not changed, recommend to contact your Eye doctor.  Morna Kipper, RN  "

## 2024-03-03 NOTE — Patient Instructions (Addendum)
 It was nice to see you today Today we: -Discussed most likely cause of burst blood vessel. These usually heal on their own in 1- 2 weeks.  -Can use warm or cold compresses 4x a day for 20 minutes at a time for comfort. -If symptoms appear over the weekend- vision changes, blurriness, headache- go to the nearest Emergency Room. -If redness is still apparent within the beginning of the week and has not changed, recommend to contact your Eye doctor.
